# Patient Record
Sex: Male | Born: 1965 | Race: Black or African American | Hispanic: No | Marital: Married | Smoking: Former smoker
Health system: Southern US, Community
[De-identification: ages and names within clinical notes are randomized; demographics above are authoritative.]

## PROBLEM LIST (undated history)

## (undated) DIAGNOSIS — M1990 Unspecified osteoarthritis, unspecified site: Secondary | ICD-10-CM

## (undated) DIAGNOSIS — R52 Pain, unspecified: Secondary | ICD-10-CM

## (undated) DIAGNOSIS — Z789 Other specified health status: Secondary | ICD-10-CM

## (undated) DIAGNOSIS — K219 Gastro-esophageal reflux disease without esophagitis: Secondary | ICD-10-CM

## (undated) DIAGNOSIS — T7840XA Allergy, unspecified, initial encounter: Secondary | ICD-10-CM

## (undated) DIAGNOSIS — I10 Essential (primary) hypertension: Secondary | ICD-10-CM

## (undated) DIAGNOSIS — H409 Unspecified glaucoma: Secondary | ICD-10-CM

## (undated) DIAGNOSIS — G4730 Sleep apnea, unspecified: Secondary | ICD-10-CM

## (undated) DIAGNOSIS — M549 Dorsalgia, unspecified: Secondary | ICD-10-CM

## (undated) DIAGNOSIS — E785 Hyperlipidemia, unspecified: Secondary | ICD-10-CM

## (undated) HISTORY — DX: Sleep apnea, unspecified: G47.30

## (undated) HISTORY — PX: WISDOM TOOTH EXTRACTION: SHX21

## (undated) HISTORY — DX: Unspecified osteoarthritis, unspecified site: M19.90

## (undated) HISTORY — DX: Allergy, unspecified, initial encounter: T78.40XA

## (undated) HISTORY — DX: Hyperlipidemia, unspecified: E78.5

## (undated) HISTORY — DX: Unspecified glaucoma: H40.9

## (undated) HISTORY — PX: TONSILLECTOMY: SUR1361

---

## 2004-01-23 ENCOUNTER — Emergency Department (HOSPITAL_COMMUNITY): Admission: EM | Admit: 2004-01-23 | Discharge: 2004-01-23 | Payer: Self-pay | Admitting: Emergency Medicine

## 2004-01-28 ENCOUNTER — Emergency Department (HOSPITAL_COMMUNITY): Admission: EM | Admit: 2004-01-28 | Discharge: 2004-01-28 | Payer: Self-pay | Admitting: Emergency Medicine

## 2010-08-28 ENCOUNTER — Emergency Department (HOSPITAL_COMMUNITY)
Admission: EM | Admit: 2010-08-28 | Discharge: 2010-08-28 | Disposition: A | Discharge status: Home / Self Care | Payer: Worker's Compensation | Attending: Emergency Medicine | Admitting: Emergency Medicine

## 2010-08-28 ENCOUNTER — Emergency Department (HOSPITAL_COMMUNITY): Payer: Worker's Compensation

## 2010-08-28 DIAGNOSIS — Y929 Unspecified place or not applicable: Secondary | ICD-10-CM | POA: Insufficient documentation

## 2010-08-28 DIAGNOSIS — S335XXA Sprain of ligaments of lumbar spine, initial encounter: Secondary | ICD-10-CM | POA: Insufficient documentation

## 2010-08-28 DIAGNOSIS — Y990 Civilian activity done for income or pay: Secondary | ICD-10-CM | POA: Insufficient documentation

## 2010-08-28 DIAGNOSIS — X500XXA Overexertion from strenuous movement or load, initial encounter: Secondary | ICD-10-CM | POA: Insufficient documentation

## 2010-08-28 DIAGNOSIS — M545 Low back pain, unspecified: Secondary | ICD-10-CM | POA: Insufficient documentation

## 2011-08-22 ENCOUNTER — Encounter (HOSPITAL_COMMUNITY): Payer: Self-pay | Admitting: Emergency Medicine

## 2011-08-22 ENCOUNTER — Emergency Department (HOSPITAL_COMMUNITY): Payer: Self-pay

## 2011-08-22 ENCOUNTER — Emergency Department (HOSPITAL_COMMUNITY)
Admission: EM | Admit: 2011-08-22 | Discharge: 2011-08-22 | Disposition: A | Discharge status: Home / Self Care | Payer: Self-pay | Attending: Emergency Medicine | Admitting: Emergency Medicine

## 2011-08-22 DIAGNOSIS — R509 Fever, unspecified: Secondary | ICD-10-CM | POA: Insufficient documentation

## 2011-08-22 DIAGNOSIS — R197 Diarrhea, unspecified: Secondary | ICD-10-CM | POA: Insufficient documentation

## 2011-08-22 DIAGNOSIS — K089 Disorder of teeth and supporting structures, unspecified: Secondary | ICD-10-CM | POA: Insufficient documentation

## 2011-08-22 DIAGNOSIS — R112 Nausea with vomiting, unspecified: Secondary | ICD-10-CM | POA: Insufficient documentation

## 2011-08-22 DIAGNOSIS — R51 Headache: Secondary | ICD-10-CM | POA: Insufficient documentation

## 2011-08-22 DIAGNOSIS — K0889 Other specified disorders of teeth and supporting structures: Secondary | ICD-10-CM

## 2011-08-22 LAB — POCT I-STAT, CHEM 8
BUN: 9 mg/dL (ref 6–23)
Calcium, Ion: 1.09 mmol/L — ABNORMAL LOW (ref 1.12–1.32)
Chloride: 104 mEq/L (ref 96–112)
Creatinine, Ser: 1.1 mg/dL (ref 0.50–1.35)
Glucose, Bld: 112 mg/dL — ABNORMAL HIGH (ref 70–99)
HCT: 42 % (ref 39.0–52.0)
Hemoglobin: 14.3 g/dL (ref 13.0–17.0)
Potassium: 3.5 mEq/L (ref 3.5–5.1)
Sodium: 141 mEq/L (ref 135–145)
TCO2: 26 mmol/L (ref 0–100)

## 2011-08-22 MED ORDER — ACETAMINOPHEN 325 MG PO TABS
325.0000 mg | ORAL_TABLET | Freq: Once | ORAL | Status: AC
  Administered 2011-08-22: 325 mg via ORAL
  Filled 2011-08-22: qty 1

## 2011-08-22 MED ORDER — ACETAMINOPHEN 325 MG PO TABS
650.0000 mg | ORAL_TABLET | Freq: Once | ORAL | Status: AC
  Administered 2011-08-22: 650 mg via ORAL
  Filled 2011-08-22: qty 2

## 2011-08-22 MED ORDER — PENICILLIN V POTASSIUM 250 MG PO TABS: 250.0000 mg | ORAL_TABLET | Freq: Four times a day (QID) | ORAL | Status: AC

## 2011-08-22 MED ORDER — SODIUM CHLORIDE 0.9 % IV BOLUS (SEPSIS)
1000.0000 mL | Freq: Once | INTRAVENOUS | Status: AC
  Administered 2011-08-22: 1000 mL via INTRAVENOUS

## 2011-08-22 MED ORDER — ONDANSETRON HCL 4 MG/2ML IJ SOLN
4.0000 mg | Freq: Once | INTRAMUSCULAR | Status: AC
  Administered 2011-08-22: 4 mg via INTRAVENOUS
  Filled 2011-08-22: qty 2

## 2011-08-22 MED ORDER — KETOROLAC TROMETHAMINE 30 MG/ML IJ SOLN
30.0000 mg | Freq: Once | INTRAMUSCULAR | Status: AC
  Administered 2011-08-22: 30 mg via INTRAVENOUS
  Filled 2011-08-22: qty 1

## 2011-08-22 NOTE — ED Notes (Signed)
Patient was covered with a blanket in spite of my request not to do so.

## 2011-08-22 NOTE — ED Provider Notes (Signed)
History     CSN: [id number]  Arrival date & time 08/22/11  1909   First MD Initiated Contact with Patient 08/22/11 1924      Chief Complaint  Patient presents with  . Fever  . Headache    (Consider location/radiation/quality/duration/timing/severity/associated sxs/prior treatment) HPI Comments: Pt states that his temperature got as high as 102.5:pt denies cough:pt states that he is having generalized myalgias:pt states that he has a fullness on his gum  Patient is a 46 y.o. male presenting with fever. The history is provided by the patient. No language interpreter was used.  Fever Primary symptoms of the febrile illness include headaches, nausea, diarrhea and myalgias. Primary symptoms do not include fever, shortness of breath, abdominal pain or vomiting. The current episode started today. This is a new problem. The problem has not changed since onset.   History reviewed. No pertinent past medical history.  History reviewed. No pertinent past surgical history.  History reviewed. No pertinent family history.  History  Substance Use Topics  . Smoking status: Former Games developer  . Smokeless tobacco: Not on file  . Alcohol Use: No      Review of Systems  Constitutional: Negative for fever.  Respiratory: Negative for shortness of breath.   Cardiovascular: Negative.   Gastrointestinal: Positive for nausea and diarrhea. Negative for vomiting and abdominal pain.  Genitourinary: Negative.   Musculoskeletal: Positive for myalgias.  Neurological: Positive for headaches.    Allergies  Review of patient's allergies indicates no known allergies.  Home Medications   Current Outpatient Rx  Name Route Sig Dispense Refill  . CALCIUM CARBONATE ANTACID 500 MG PO CHEW Oral Chew 1 tablet by mouth as needed. For heartburn.      BP 135/79  Pulse 99  Temp(Src) 100.4 F (38 C) (Oral)  Resp 20  SpO2 95%  Physical Exam  Nursing note and vitals reviewed. Constitutional: He is oriented  to person, place, and time. He appears well-developed and well-nourished.  HENT:  Right Ear: External ear normal.  Left Ear: External ear normal.  Mouth/Throat: Oropharynx is clear and moist.       Pt has a fullness noted to right lower gum  Eyes: Conjunctivae and EOM are normal.  Neck: Normal range of motion. Neck supple.  Cardiovascular: Normal rate and regular rhythm.   Pulmonary/Chest: Effort normal and breath sounds normal.  Abdominal: Soft. Bowel sounds are normal. There is no tenderness.  Musculoskeletal: Normal range of motion.  Neurological: He is alert and oriented to person, place, and time.  Skin: Skin is warm and dry.  Psychiatric: He has a normal mood and affect.    ED Course  Procedures (including critical care time)  Labs Reviewed - No data to display Dg Chest 2 View  08/22/2011  *RADIOLOGY REPORT*  Clinical Data: Fever; history of smoking.  CHEST - 2 VIEW  Comparison: None.  Findings: The lungs are well-aerated.  Mild bilateral streaky atelectasis or scarring is noted.  Mild hilar prominence appears to reflect normal vasculature.  There is no evidence of pleural effusion or pneumothorax.  The heart is normal in size; the mediastinal contour is within normal limits.  No acute osseous abnormalities are seen.  IMPRESSION: Mild bilateral streaky atelectasis or scarring noted; no definite evidence of focal airspace consolidation.  Original Report Authenticated By: Tonia Ghent, M.D.     1. Diarrhea   2. Fever   3. Toothache       MDM  Pt is feeling a lot better  at this time:symptoms likely viral:will treat pt for the toothache:pt not having any meningismus signs        Teressa Lower, NP 08/22/11 2322

## 2011-08-22 NOTE — ED Provider Notes (Signed)
Medical screening examination/treatment/procedure(s) were performed by non-physician practitioner and as supervising physician I was immediately available for consultation/collaboration.  Adesuwa Osgood R. Natacha Jepsen, MD 08/22/11 2335 

## 2011-08-22 NOTE — ED Notes (Signed)
Patient is AOx4 and comfortable with his discharge instructions. 

## 2011-08-22 NOTE — ED Notes (Signed)
Patient complaining of headache, fever (102.5), dizziness, nausea, and body aches since this morning.  Patient states that his throat was irritated this morning, but this may be due to his allergies.  Denies recent cold symptoms, shortness of breath and chest pain.

## 2011-08-22 NOTE — Discharge Instructions (Signed)
Dental Pain A tooth ache may be caused by cavities (tooth decay). Cavities expose the nerve of the tooth to air and hot or cold temperatures. It may come from an infection or abscess (also called a boil or furuncle) around your tooth. It is also often caused by dental caries (tooth decay). This causes the pain you are having. DIAGNOSIS  Your caregiver can diagnose this problem by exam. TREATMENT   If caused by an infection, it may be treated with medications which kill germs (antibiotics) and pain medications as prescribed by your caregiver. Take medications as directed.   Only take over-the-counter or prescription medicines for pain, discomfort, or fever as directed by your caregiver.   Whether the tooth ache today is caused by infection or dental disease, you should see your dentist as soon as possible for further care.  SEEK MEDICAL CARE IF: The exam and treatment you received today has been provided on an emergency basis only. This is not a substitute for complete medical or dental care. If your problem worsens or new problems (symptoms) appear, and you are unable to meet with your dentist, call or return to this location. SEEK IMMEDIATE MEDICAL CARE IF:   You have a fever.   You develop redness and swelling of your face, jaw, or neck.   You are unable to open your mouth.   You have severe pain uncontrolled by pain medicine.  MAKE SURE YOU:   Understand these instructions.   Will watch your condition.   Will get help right away if you are not doing well or get worse.  Document Released: 04/06/2005 Document Revised: 03/26/2011 Document Reviewed: 11/23/2007 [facility] Patient Information 2012 [place], [place].Diarrhea Diarrhea is watery poop (stool). The most common cause of diarrhea is a germ. Other causes include:  Food poisoning.   A reaction to medicine.  HOME CARE   Drink clear fluids. This can stop you from losing too much body fluid (dehydration).   Drink enough fluids  to keep your pee (urine) clear or pale yellow.   Avoid solid foods and dairy products until you start to feel better. Then start eating bland foods, such as:   Bananas.   Rice.   Crackers.   Applesauce.   Dry toast.   Avoid spicy foods, caffeine, and alcohol.   Your doctor may give medicine to help with cramps and watery poop. Take this as told. Avoid these medicines if you have a fever or blood in your poop.   Take your medicine as told. Finish them even if you start to feel better.  GET HELP RIGHT AWAY IF:   The watery poop lasts longer than 3 days.   You have a fever.   Your baby is older than 3 months with a rectal temperature of 100.5 F (38.1 C) or higher for more than 1 day.   There is blood in your poop.   You start to throw up (vomit).   You lose too much fluid.  MAKE SURE YOU:   Understand these instructions.   Will watch your condition.   Will get help right away if you are not doing well or get worse.  Document Released: 09/23/2007 Document Revised: 03/26/2011 Document Reviewed: 09/23/2007 [facility] Patient Information 2012 [place], [place].Fever  Fever is a higher-than-normal body temperature. A normal temperature varies with:  Age.   How it is measured (mouth, underarm, rectal, or ear).   Time of day.  In an adult, an oral temperature around 98.6 Fahrenheit (F) or  37 Celsius (C) is considered normal. A rise in temperature of about 1.8 F or 1 C is generally considered a fever (100.4 F or 38 C). In an infant age 12 days or less, a rectal temperature of 100.4 F (38 C) generally is regarded as fever. Fever is not a disease but can be a symptom of illness. CAUSES   Fever is most commonly caused by infection.   Some non-infectious problems can cause fever. For example:   Some arthritis problems.   Problems with the thyroid or adrenal glands.   Immune system problems.   Some kinds of cancer.   A reaction to certain medicines.    Occasionally, the source of a fever cannot be determined. This is sometimes called a "Fever of Unknown Origin" (FUO).   Some situations may lead to a temporary rise in body temperature that may go away on its own. Examples are:   Childbirth.   Surgery.   Some situations may cause a rise in body temperature but these are not considered "true fever". Examples are:   Intense exercise.   Dehydration.   Exposure to high outside or room temperatures.  SYMPTOMS   Feeling warm or hot.   Fatigue or feeling exhausted.   Aching all over.   Chills.   Shivering.   Sweats.  DIAGNOSIS  A fever can be suspected by your caregiver feeling that your skin is unusually warm. The fever is confirmed by taking a temperature with a thermometer. Temperatures can be taken different ways. Some methods are accurate and some are not: With adults, adolescents, and children:   An oral temperature is used most commonly.   An ear thermometer will only be accurate if it is positioned as recommended by the manufacturer.   Under the arm temperatures are not accurate and not recommended.   Most electronic thermometers are fast and accurate.  Infants and Toddlers:  Rectal temperatures are recommended and most accurate.   Ear temperatures are not accurate in this age group and are not recommended.   Skin thermometers are not accurate.  RISKS AND COMPLICATIONS   During a fever, the body uses more oxygen, so a person with a fever may develop rapid breathing or shortness of breath. This can be dangerous especially in people with heart or lung disease.   The sweats that occur following a fever can cause dehydration.   High fever can cause seizures in infants and children.   Older persons can develop confusion during a fever.  TREATMENT   Medications may be used to control temperature.   Do not give aspirin to children with fevers. There is an association with Reye's syndrome. Reye's syndrome is a  rare but potentially deadly disease.   If an infection is present and medications have been prescribed, take them as directed. Finish the full course of medications until they are gone.   Sponging or bathing with room-temperature water may help reduce body temperature. Do not use ice water or alcohol sponge baths.   Do not over-bundle children in blankets or heavy clothes.   Drinking adequate fluids during an illness with fever is important to prevent dehydration.  HOME CARE INSTRUCTIONS   For adults, rest and adequate fluid intake are important. Dress according to how you feel, but do not over-bundle.   Drink enough water and/or fluids to keep your urine clear or pale yellow.   For infants over 3 months and children, giving medication as directed by your caregiver to control fever  can help with comfort. The amount to be given is based on the child's weight. Do NOT give more than is recommended.  SEEK MEDICAL CARE IF:   You or your child are unable to keep fluids down.   Vomiting or diarrhea develops.   You develop a skin rash.   An oral temperature above 102 F (38.9 C) develops, or a fever which persists for over 3 days.   You develop excessive weakness, dizziness, fainting or extreme thirst.   Fevers keep coming back after 3 days.  SEEK IMMEDIATE MEDICAL CARE IF:   Shortness of breath or trouble breathing develops   You pass out.   You feel you are making little or no urine.   New pain develops that was not there before (such as in the head, neck, chest, back, or abdomen).   You cannot hold down fluids.   Vomiting and diarrhea persist for more than a day or two.   You develop a stiff neck and/or your eyes become sensitive to light.   An unexplained temperature above 102 F (38.9 C) develops.  Document Released: 04/06/2005 Document Revised: 03/26/2011 Document Reviewed: 03/22/2008 [facility] Patient Information 2012 [place], [place].

## 2012-02-08 ENCOUNTER — Other Ambulatory Visit: Payer: Self-pay | Admitting: Orthopedic Surgery

## 2012-02-08 DIAGNOSIS — M519 Unspecified thoracic, thoracolumbar and lumbosacral intervertebral disc disorder: Secondary | ICD-10-CM

## 2012-02-17 ENCOUNTER — Other Ambulatory Visit: Payer: Self-pay | Admitting: Orthopedic Surgery

## 2012-02-17 ENCOUNTER — Ambulatory Visit
Admission: RE | Admit: 2012-02-17 | Discharge: 2012-02-17 | Disposition: A | Discharge status: Home / Self Care | Payer: Medicaid Other | Source: Ambulatory Visit | Attending: Orthopedic Surgery | Admitting: Orthopedic Surgery

## 2012-02-17 VITALS — BP 125/72 | HR 54 | Temp 98.3°F | Resp 12

## 2012-02-17 DIAGNOSIS — M519 Unspecified thoracic, thoracolumbar and lumbosacral intervertebral disc disorder: Secondary | ICD-10-CM

## 2012-02-17 DIAGNOSIS — M549 Dorsalgia, unspecified: Secondary | ICD-10-CM

## 2012-02-17 DIAGNOSIS — Z789 Other specified health status: Secondary | ICD-10-CM

## 2012-02-17 HISTORY — DX: Other specified health status: Z78.9

## 2012-02-17 MED ORDER — KETOROLAC TROMETHAMINE 30 MG/ML IJ SOLN
30.0000 mg | Freq: Once | INTRAMUSCULAR | Status: AC
  Administered 2012-02-17: 30 mg via INTRAVENOUS

## 2012-02-17 MED ORDER — CEFAZOLIN SODIUM 1-5 GM-% IV SOLN
1.0000 g | Freq: Once | INTRAVENOUS | Status: AC
  Administered 2012-02-17: 1 g via INTRAVENOUS

## 2012-02-17 MED ORDER — FENTANYL CITRATE 0.05 MG/ML IJ SOLN
25.0000 ug | INTRAMUSCULAR | Status: DC | PRN
  Administered 2012-02-17: 100 ug via INTRAVENOUS

## 2012-02-17 MED ORDER — IOHEXOL 180 MG/ML  SOLN
7.0000 mL | Freq: Once | INTRAMUSCULAR | Status: AC | PRN
  Administered 2012-02-17: 7 mL

## 2012-02-17 MED ORDER — MIDAZOLAM HCL 2 MG/2ML IJ SOLN
1.0000 mg | INTRAMUSCULAR | Status: DC | PRN
  Administered 2012-02-17: 1 mg via INTRAVENOUS

## 2012-02-17 MED ORDER — SODIUM CHLORIDE 0.9 % IV SOLN
Freq: Once | INTRAVENOUS | Status: AC
  Administered 2012-02-17: 08:00:00 via INTRAVENOUS

## 2012-02-17 NOTE — Progress Notes (Signed)
Heart sounds regular and lungs clear bilaterally. 

## 2012-02-17 NOTE — Progress Notes (Addendum)
Pt is comfortable at present.   0910 sedation time 25 minutes.dd

## 2012-03-25 ENCOUNTER — Emergency Department (HOSPITAL_COMMUNITY): Payer: Medicaid Other

## 2012-03-25 ENCOUNTER — Emergency Department (HOSPITAL_COMMUNITY)
Admission: EM | Admit: 2012-03-25 | Discharge: 2012-03-25 | Disposition: A | Discharge status: Home / Self Care | Payer: Medicaid Other | Attending: Emergency Medicine | Admitting: Emergency Medicine

## 2012-03-25 ENCOUNTER — Encounter (HOSPITAL_COMMUNITY): Payer: Self-pay | Admitting: *Deleted

## 2012-03-25 DIAGNOSIS — E876 Hypokalemia: Secondary | ICD-10-CM | POA: Insufficient documentation

## 2012-03-25 DIAGNOSIS — Z79899 Other long term (current) drug therapy: Secondary | ICD-10-CM | POA: Insufficient documentation

## 2012-03-25 DIAGNOSIS — K219 Gastro-esophageal reflux disease without esophagitis: Secondary | ICD-10-CM | POA: Insufficient documentation

## 2012-03-25 DIAGNOSIS — R079 Chest pain, unspecified: Secondary | ICD-10-CM

## 2012-03-25 DIAGNOSIS — Z87891 Personal history of nicotine dependence: Secondary | ICD-10-CM | POA: Insufficient documentation

## 2012-03-25 HISTORY — DX: Gastro-esophageal reflux disease without esophagitis: K21.9

## 2012-03-25 LAB — BASIC METABOLIC PANEL
BUN: 11 mg/dL (ref 6–23)
CO2: 27 mEq/L (ref 19–32)
CO2: 27 mEq/L (ref 19–32)
Calcium: 8.7 mg/dL (ref 8.4–10.5)
Calcium: 8.6 mg/dL (ref 8.4–10.5)
Chloride: 106 mEq/L (ref 96–112)
Creatinine, Ser: 0.94 mg/dL (ref 0.50–1.35)
Creatinine, Ser: 0.95 mg/dL (ref 0.50–1.35)
GFR calc Af Amer: >90 mL/min (ref >90)
GFR calc non Af Amer: >90 mL/min (ref >90)

## 2012-03-25 LAB — CBC
MCH: 26.8 pg (ref 26.0–34.0)
MCHC: 32.1 g/dL (ref 30.0–36.0)
MCV: 83.4 fL (ref 78.0–100.0)
Platelets: 245 10*3/uL (ref 150–400)
RBC: 4.63 MIL/uL (ref 4.22–5.81)

## 2012-03-25 LAB — POCT I-STAT TROPONIN I: Troponin i, poc: 0 ng/mL (ref 0.00–0.08)

## 2012-03-25 MED ORDER — PANTOPRAZOLE SODIUM 40 MG IV SOLR
40.0000 mg | Freq: Once | INTRAVENOUS | Status: AC
  Administered 2012-03-25: 40 mg via INTRAVENOUS
  Filled 2012-03-25: qty 40

## 2012-03-25 MED ORDER — POTASSIUM CHLORIDE CRYS ER 20 MEQ PO TBCR
40.0000 meq | EXTENDED_RELEASE_TABLET | Freq: Once | ORAL | Status: AC
  Administered 2012-03-25: 40 meq via ORAL
  Filled 2012-03-25: qty 2

## 2012-03-25 MED ORDER — POTASSIUM CHLORIDE 10 MEQ/100ML IV SOLN
10.0000 meq | Freq: Once | INTRAVENOUS | Status: AC
  Administered 2012-03-25: 10 meq via INTRAVENOUS
  Filled 2012-03-25: qty 100

## 2012-03-25 MED ORDER — ASPIRIN 325 MG PO TABS
325.0000 mg | ORAL_TABLET | ORAL | Status: AC
  Administered 2012-03-25: 325 mg via ORAL
  Filled 2012-03-25: qty 1

## 2012-03-25 MED ORDER — FAMOTIDINE IN NACL 20-0.9 MG/50ML-% IV SOLN
20.0000 mg | Freq: Once | INTRAVENOUS | Status: AC
  Administered 2012-03-25: 20 mg via INTRAVENOUS
  Filled 2012-03-25: qty 50

## 2012-03-25 MED ORDER — NITROGLYCERIN 0.4 MG SL SUBL: 0.4000 mg | SUBLINGUAL_TABLET | SUBLINGUAL | Status: DC | PRN

## 2012-03-25 MED ORDER — NITROGLYCERIN 0.4 MG SL SUBL
0.4000 mg | SUBLINGUAL_TABLET | SUBLINGUAL | Status: DC | PRN
  Administered 2012-03-25: 0.4 mg via SUBLINGUAL
  Filled 2012-03-25: qty 25

## 2012-03-25 NOTE — ED Notes (Signed)
Pt returned from xray

## 2012-03-25 NOTE — ED Provider Notes (Signed)
Medical screening examination/treatment/procedure(s) were performed by non-physician practitioner and as supervising physician I was immediately available for consultation/collaboration.  Chinedum Vanhouten, MD 03/25/12 0803 

## 2012-03-25 NOTE — ED Notes (Signed)
Pt updated on plan of care: aware of pending medications and internal medicine consult.

## 2012-03-25 NOTE — ED Notes (Signed)
Patient transported to X-ray 

## 2012-03-25 NOTE — ED Notes (Signed)
Telephone order received from Dr. Algie Coffer. Ok to d/c patient home with office follow up for next week. Also given script for nitrogylcerin and instructed on use as well as when to return to emergency room.

## 2012-03-25 NOTE — ED Notes (Signed)
Pt A.O. X 4. Reports left sided chest pain 3/10 described as pressure. Radiated to neck, back, and shoulder. States "some times it hurts on the right, sometimes the left. Sometimes both sides at once". Several episodes, intermittent x 6 months. NAD. NSR. Vitals stable. Family at bedside. Updated on plan of care: aware that he will be transported to x-ray shortly. No further needs at this time.

## 2012-03-25 NOTE — H&P (Signed)
Kyle Pham is an 46 y.o. male.   Chief Complaint: Chest pain HPI: 46 years old male with left sided chest pain and neck pain with LUE numbness this AM. Currently chest pain free. No fever or cough or trauma.  Past Medical History  Diagnosis Date  . No pertinent past medical history Feb 17, 2012     none at this time  . GERD (gastroesophageal reflux disease)       Past Surgical History  Procedure Date  . Tonsillectomy 46 years old  . Wisdom tooth extraction 46 years old    History reviewed. No pertinent family history. Social History:  reports that he quit smoking about 8 months ago. His smoking use included Cigarettes. He has a 16 pack-year smoking history. He does not have any smokeless tobacco history on file. He reports that he does not drink alcohol or use illicit drugs.  Allergies: No Known Allergies   (Not in a hospital admission)  Results for orders placed during the hospital encounter of 03/25/12 (from the past 48 hour(s))  CBC     Status: Abnormal   Collection Time   03/25/12  2:17 AM      Component Value Range Comment   WBC 8.3  4.0 - 10.5 K/uL    RBC 4.63  4.22 - 5.81 MIL/uL    Hemoglobin 12.4 (*) 13.0 - 17.0 g/dL    HCT 16.1 (*) 09.6 - 52.0 %    MCV 83.4  78.0 - 100.0 fL    MCH 26.8  26.0 - 34.0 pg    MCHC 32.1  30.0 - 36.0 g/dL    RDW 04.5  40.9 - 81.1 %    Platelets 245  150 - 400 K/uL   BASIC METABOLIC PANEL     Status: Abnormal   Collection Time   03/25/12  2:17 AM      Component Value Range Comment   Sodium 143  135 - 145 mEq/L    Potassium 2.9 (*) 3.5 - 5.1 mEq/L    Chloride 105  96 - 112 mEq/L    CO2 27  19 - 32 mEq/L    Glucose, Bld 141 (*) 70 - 99 mg/dL    BUN 13  6 - 23 mg/dL    Creatinine, Ser 9.14  0.50 - 1.35 mg/dL    Calcium 8.7  8.4 - 78.2 mg/dL    GFR calc non Af Amer >90  >90 mL/min    GFR calc Af Amer >90  >90 mL/min   POCT I-STAT TROPONIN I     Status: Normal   Collection Time   03/25/12  4:18 AM      Component Value Range  Comment   Troponin i, poc 0.00  0.00 - 0.08 ng/mL    Comment 3             Dg Chest 2 View  03/25/2012  *RADIOLOGY REPORT*  Clinical Data: Anterior chest pain.  CHEST - 2 VIEW  Comparison: Chest radiograph performed 08/22/2011  Findings: The lungs are well-aerated.  Minimal left midlung scarring is seen.  There is no evidence of focal opacification, pleural effusion or pneumothorax.  The heart is normal in size; the mediastinal contour is within normal limits. Slight left hilar prominence is stable in appearance, and likely reflects normal vasculature.  No acute osseous abnormalities are seen.  IMPRESSION: No acute cardiopulmonary process seen.   Original Report Authenticated By: Tonia Ghent, M.D.     @ROS @ No weight gain,  No vision or speech problem, No stroke, seizures or psych admission, + chest pain, No asthma, No GI bleed or kidney stone.  Physical Exam   Blood pressure 124/65, pulse 56, temperature 98.2 F (36.8 C), temperature source Oral, resp. rate 16, SpO2 97.00%.  Constitutional: He appears well-developed and well-nourished. No distress.  HENT: Normocephalic and atraumatic. Eyes: Manson Passey, conj-pink, Sclera-white.  Neck: Normal range of motion.  Cardiovascular: Normal rate, regular rhythm. S1 and S2 normal Pulmonary/Chest: Effort normal and breath sounds normal. No respiratory distress. He exhibits no tenderness.  No pleuritic pain reported  Abdominal: Soft. Bowel sounds are normal. Non-distended and non-tender. Musculoskeletal: Normal range of motion.  Neurological: He is alert and oriented to person, place, and time.  Skin: Skin is warm and dry. No rash noted.  Psychiatric: He has a normal mood and affect. His behavior is normal.   Assessment/Plan Chest pain r/o CAD Hypokalemia  Repeat troponin I, K + replacement Nuclear stress test v/s cardiac cath.  Joanette Silveria S 03/25/2012, 6:31 AM

## 2012-03-25 NOTE — ED Provider Notes (Signed)
History     CSN: [id number]  Arrival date & time 03/25/12  0139   First MD Initiated Contact with Patient 03/25/12 0228      Chief Complaint  Patient presents with  . Chest Pain    (Consider location/radiation/quality/duration/timing/severity/associated sxs/prior treatment) HPI History provided by pt.   Pt got up to use bathroom at 1am today.  On his way back to bed he developed pain/paresthesias of LUE as well pain in left side of chest.  Associated w/ mild SOB and slight nausea.  Sx have been constant ever since, but have gradually improved.  Pt has chronic neck pain from remote injury, as well as intermittent LUE paresthesias, particularly when he extends his neck.  Symptoms different this morning because unrelieved by shaking out arm.  Has also had intermittent, migrating CP for the past 6 months.  Pain non-exertional, no associated sx and relieved by pepcid.  He attributes to acid reflux.  Has discussed w/ Dr. Algie Coffer who plans to schedule him for stress echo.  Pt denies fever, cough, abdominal pain, recent exertional SOB and lower extremity edema.  Denies trauma.   Past Medical History  Diagnosis Date  . No pertinent past medical history Feb 17, 2012     none at this time  . GERD (gastroesophageal reflux disease)     Past Surgical History  Procedure Date  . Tonsillectomy 46 years old  . Wisdom tooth extraction 46 years old    History reviewed. No pertinent family history.  History  Substance Use Topics  . Smoking status: Former Smoker -- 0.5 packs/day for 32 years    Types: Cigarettes    Quit date: 07/18/2011  . Smokeless tobacco: Not on file  . Alcohol Use: No      Review of Systems  All other systems reviewed and are negative.    Allergies  Review of patient's allergies indicates no known allergies.  Home Medications   Current Outpatient Rx  Name  Route  Sig  Dispense  Refill  . BISMUTH SUBSALICYLATE 262 MG/15ML PO SUSP   Oral   Take 15 mLs by mouth  every 6 (six) hours as needed. For nausea         . FAMOTIDINE 10 MG PO CHEW   Oral   Chew 10 mg by mouth 2 (two) times daily.         Marland Kitchen NAPROXEN SODIUM 220 MG PO TABS   Oral   Take 220 mg by mouth 2 (two) times daily as needed. For pain           BP 135/75  Pulse 58  Temp 98.2 F (36.8 C) (Oral)  Resp 16  SpO2 97%  Physical Exam  Nursing note and vitals reviewed. Constitutional: He is oriented to person, place, and time. He appears well-developed and well-nourished. No distress.  HENT:  Head: Normocephalic and atraumatic.  Eyes:       Normal appearance  Neck: Normal range of motion.  Cardiovascular: Normal rate, regular rhythm and intact distal pulses.   Pulmonary/Chest: Effort normal and breath sounds normal. No respiratory distress. He exhibits no tenderness.       No pleuritic pain reported  Abdominal: Soft. Bowel sounds are normal. He exhibits no distension. There is no tenderness. There is no guarding.  Musculoskeletal: Normal range of motion.       No peripheral edema or calf tenderness.  Pain in chest is not aggravated by passive ROM of LUE.  LUE w/out edema, skin  changes or tenderness.  No pain w/ ROM of wrist and elbow.  2+ radial pulse and distal sensation intact.      Neurological: He is alert and oriented to person, place, and time.  Skin: Skin is warm and dry. No rash noted.  Psychiatric: He has a normal mood and affect. His behavior is normal.    ED Course  Procedures (including critical care time)   Date: 03/25/2012  Rate: 77  Rhythm: normal sinus rhythm  QRS Axis: normal  Intervals: normal  ST/T Wave abnormalities: nonspecific ST/T changes  Conduction Disutrbances:none  Narrative Interpretation:   Old EKG Reviewed: none available   Labs Reviewed  CBC - Abnormal; Notable for the following:    Hemoglobin 12.4 (*)     HCT 38.6 (*)     All other components within normal limits  BASIC METABOLIC PANEL - Abnormal; Notable for the following:     Potassium 2.9 (*)     Glucose, Bld 141 (*)     All other components within normal limits  POCT I-STAT TROPONIN I   Dg Chest 2 View  03/25/2012  *RADIOLOGY REPORT*  Clinical Data: Anterior chest pain.  CHEST - 2 VIEW  Comparison: Chest radiograph performed 08/22/2011  Findings: The lungs are well-aerated.  Minimal left midlung scarring is seen.  There is no evidence of focal opacification, pleural effusion or pneumothorax.  The heart is normal in size; the mediastinal contour is within normal limits. Slight left hilar prominence is stable in appearance, and likely reflects normal vasculature.  No acute osseous abnormalities are seen.  IMPRESSION: No acute cardiopulmonary process seen.   Original Report Authenticated By: Tonia Ghent, M.D.      1. Chest pain   2. Hypokalemia       MDM  Healthy 46yo M presents w/ non-exertional L-sided CP w/ associated mild SOB as well as LUE paresthesias.  Has had intermittent CP for the past 6 months which has been attributed to GERD and of which Dr. Algie Coffer is aware and planning to obtain stress echo. LUE paresthesias are also chronic and have been attributed to cervical radiculopathy.  Difference in sx tonight is prolonged duration.  Pain minimal currently.  EKG shows diffuse flipped t waves and slight ST depression.  No prior for comparison.  Labs otherwise sig for neg troponin and hypokalemia, likely d/t recent viral GI illness.  Consulted Dr. Algie Coffer and he will admit patient for further evaluation.  Discussed w/ patient and his wife.  He has received aspirin as well as potassium supplementation.         Otilio Miu, PA-C 03/25/12 [phone]

## 2012-03-25 NOTE — ED Notes (Signed)
Pt is here for "chest pain every other day for 6 months",  Pt was evaluated "years ago" for similar pain and diagnosed with GERD and given pepcid AC.  Pt denies any n/v with this.  Does have sob with this at times.

## 2012-03-25 NOTE — ED Provider Notes (Signed)
I was asked by the patient's nurse, who spoke directly to Dr. Algie Coffer, to provide rx for nitroglycerin for this patient who is being discharged to home.   Renne Crigler, [place] 03/25/12 1040

## 2012-03-26 NOTE — ED Provider Notes (Signed)
Medical screening examination/treatment/procedure(s) were performed by non-physician practitioner and as supervising physician I was immediately available for consultation/collaboration.  Sunnie Nielsen, MD 03/26/12 [phone]

## 2012-05-04 ENCOUNTER — Encounter: Payer: Self-pay | Admitting: Physical Medicine & Rehabilitation

## 2012-05-17 ENCOUNTER — Encounter: Payer: Medicaid Other | Attending: Physical Medicine & Rehabilitation

## 2012-05-17 ENCOUNTER — Ambulatory Visit (HOSPITAL_BASED_OUTPATIENT_CLINIC_OR_DEPARTMENT_OTHER): Payer: Medicaid Other | Admitting: Physical Medicine & Rehabilitation

## 2012-05-17 ENCOUNTER — Telehealth: Payer: Self-pay | Admitting: Physical Medicine & Rehabilitation

## 2012-05-17 ENCOUNTER — Encounter: Payer: Self-pay | Admitting: Physical Medicine & Rehabilitation

## 2012-05-17 VITALS — BP 147/76 | HR 56 | Resp 14 | Ht 70.0 in | Wt 242.0 lb

## 2012-05-17 DIAGNOSIS — Z5181 Encounter for therapeutic drug level monitoring: Secondary | ICD-10-CM

## 2012-05-17 DIAGNOSIS — M79609 Pain in unspecified limb: Secondary | ICD-10-CM | POA: Insufficient documentation

## 2012-05-17 DIAGNOSIS — G8929 Other chronic pain: Secondary | ICD-10-CM | POA: Insufficient documentation

## 2012-05-17 DIAGNOSIS — M549 Dorsalgia, unspecified: Secondary | ICD-10-CM

## 2012-05-17 DIAGNOSIS — M545 Low back pain, unspecified: Secondary | ICD-10-CM | POA: Insufficient documentation

## 2012-05-17 DIAGNOSIS — G894 Chronic pain syndrome: Secondary | ICD-10-CM | POA: Insufficient documentation

## 2012-05-17 DIAGNOSIS — IMO0001 Reserved for inherently not codable concepts without codable children: Secondary | ICD-10-CM

## 2012-05-17 DIAGNOSIS — M542 Cervicalgia: Secondary | ICD-10-CM

## 2012-05-17 DIAGNOSIS — M7918 Myalgia, other site: Secondary | ICD-10-CM | POA: Insufficient documentation

## 2012-05-17 DIAGNOSIS — M47817 Spondylosis without myelopathy or radiculopathy, lumbosacral region: Secondary | ICD-10-CM | POA: Insufficient documentation

## 2012-05-17 MED ORDER — DULOXETINE HCL 30 MG PO CPEP: 30.0000 mg | ORAL_CAPSULE | Freq: Every day | ORAL | Status: DC

## 2012-05-17 MED ORDER — METHOCARBAMOL 750 MG PO TABS: 750.0000 mg | ORAL_TABLET | Freq: Every evening | ORAL | Status: DC | PRN

## 2012-05-17 NOTE — Telephone Encounter (Signed)
Patient wants to know if Dr. Wynn Banker still wants him to have x-ray even though he brought in MRI results. Also, patient wants to know if you are using [facility] on [address]

## 2012-05-17 NOTE — Telephone Encounter (Signed)
yes

## 2012-05-17 NOTE — Patient Instructions (Signed)
Will do medial branch blocks to evaluate for lumbar arthritis as the cause of your pain After that we will do EMG to evaluate the cause of your left leg pain I do not think narcotics will be necessary for your pain symptoms We'll try Cymbalta 30 mg per day Methocarbamol 750 mg at night may repeat x1 Physical therapy 4 visits for neck pain also evaluate for TENS unit

## 2012-05-17 NOTE — Telephone Encounter (Signed)
Message forwarded to Dr. Wynn Banker

## 2012-05-17 NOTE — Progress Notes (Signed)
Subjective:    Patient ID: Kyle Pham, male    DOB: Feb 15, 1966, 47 y.o.   MRN: [id number]  HPI 47 year old male with long history of back pain. Work injury 08/28/2010. Workers comp case was settled. Was treated in the past by Dr. Georgiann Hahn as well as Dr. Madelon Lips. Evaluated outside of the workers comp system by Dr. Rochele Pages on 02/05/2012. D. Discography was performed at [facility] imaging. L3-4 no concordant pain L4-5 concordant pain but no posterior annular tear L5-S1 concordant pain but no annular tear. No evidence of nerve root impingement. Secondary complaint is neck pain Tried L5 epidural which was not helpful. Physical therapy to workers comp for low back pain. Patient reports it was not helpful.  Tried 7.5 mg Vicodin tablets in October. Takes Aleve 2 tablets twice a day. This helps a little for his neck about 20-25%. Does not help back pain. Changing positions helps with the pain  Reviewed notes from Dr Alveda Reasons and [facility] Imaging Pain Inventory Average Pain 8 Pain Right Now 10 My pain is sharp, burning, stabbing, tingling and aching  In the last 24 hours, has pain interfered with the following? General activity 9 Relation with others 9 Enjoyment of life 10 What TIME of day is your pain at its worst? all Sleep (in general) Poor  Pain is worse with: walking, bending, sitting, standing and some activites Pain improves with: no pain med Relief from Meds: no pain med  Mobility walk without assistance how many minutes can you walk? 5-10 do you drive?  yes  Function not employed: date last employed  I need assistance with the following:  dressing, bathing, meal prep, household duties and shopping  Neuro/Psych weakness numbness tingling trouble walking  Prior Studies Any changes since last visit?  no  CT discogram Oct 2013 L3-L4: There is a moderate sized extraforaminal annular tear on  the right without definite encroachment of the nerve root in the  foramen.  There is no posterior annular rent in the canal. It is  possible the right L3 nerve root could be irritated.  Circumferential annular spread of contrast along with a small  Schmorl's nodes incidentally noted. No significant facet  arthropathy.  L4-L5: No posterior or posterolateral annular tear is seen. There  is a prominent anterior annular tear extending toward the  retroperitoneum. Moderate sized Schmorl's node is seen.  Circumferential annular spread of contrast. No spinal stenosis or  L5 nerve root encroachment. No significant foraminal narrowing.  L5-S1: 2 mm of facet mediated retrolisthesis L5 on S1. No pars  defects. Mild facet arthropathy. Moderate sized Schmorl's node.  No annular tears identified.   Physicians involved in your care Neurosurgeon [place]--no surgery   Family History  Problem Relation Age of Onset  . Diabetes Mother   . Hypertension Mother   . Glaucoma Mother   . Kidney disease Mother    History   Social History  . Marital Status: Married    Spouse Name: N/A    Number of Children: N/A  . Years of Education: N/A   Social History Main Topics  . Smoking status: Former Smoker -- 0.5 packs/day for 32 years    Types: Cigarettes    Quit date: 07/18/2011  . Smokeless tobacco: Never Used  . Alcohol Use: No  . Drug Use: No  . Sexually Active: None   Other Topics Concern  . None   Social History Narrative  . None   Past Surgical History  Procedure Date  . Tonsillectomy 47 years  old  . Wisdom tooth extraction 47 years old   Past Medical History  Diagnosis Date  . No pertinent past medical history Feb 17, 2012     none at this time  . GERD (gastroesophageal reflux disease)    BP 147/76  Pulse 56  Resp 14  Ht 5\' 10"  (1.778 m)  Wt 242 lb (109.77 kg)  BMI 34.72 kg/m2  SpO2 95%   Review of Systems  Musculoskeletal: Positive for gait problem.  Neurological: Positive for weakness and numbness.       Tingling   All other systems reviewed and  are negative.       Objective:   Physical Exam  Nursing note and vitals reviewed. Constitutional: He appears well-developed.       overwt  HENT:  Head: Normocephalic and atraumatic.  Eyes: Conjunctivae normal and EOM are normal. Pupils are equal, round, and reactive to light.  Neurological: He is alert. No cranial nerve deficit. Gait abnormal. Coordination normal.       Positive Waddell sign axial compression Assist with head forward Tenderness over the occipital area bilateral Giveaway weakness left ankle otherwise normal strength Decreased sensation left L3-L4 L5-S1 dermatomal distribution  Psychiatric: His affect is blunt. His speech is delayed. He is slowed.          Assessment & Plan:  1. Lumbar pain. No clear-cut reason for this. The discogram was inconclusive. There is some evidence of arthritis in the L5-S1 joint. Further assessment with medial branch blocks recommended. 2. Left leg pain as well as numbness. CAT scan does not show any nerve compression. Would need to check an EMG to further assess 3. Neck pain appears to be muscular. Would check it x-ray of his neck as well. Would benefit from physical therapy for this diagnosis. Will also order a TENS unit. 4. Chronic pain syndrome may have some depression related to this. Trial of Cymbalta this is also indicated for chronic back pain. Will also try Robaxin at night.

## 2012-05-18 NOTE — Telephone Encounter (Signed)
Please have patient follow up on xray per Dr Wynn Banker.

## 2012-05-30 ENCOUNTER — Ambulatory Visit
Payer: Medicaid Other | Attending: Physical Medicine & Rehabilitation | Admitting: Rehabilitative and Restorative Service Providers"

## 2012-06-16 ENCOUNTER — Ambulatory Visit: Payer: Medicaid Other | Admitting: Physical Medicine & Rehabilitation

## 2012-06-16 ENCOUNTER — Encounter: Payer: Medicaid Other | Attending: Physical Medicine & Rehabilitation

## 2012-06-16 DIAGNOSIS — M79609 Pain in unspecified limb: Secondary | ICD-10-CM | POA: Insufficient documentation

## 2012-06-16 DIAGNOSIS — M542 Cervicalgia: Secondary | ICD-10-CM | POA: Insufficient documentation

## 2012-06-16 DIAGNOSIS — M549 Dorsalgia, unspecified: Secondary | ICD-10-CM | POA: Insufficient documentation

## 2012-06-16 DIAGNOSIS — G894 Chronic pain syndrome: Secondary | ICD-10-CM | POA: Insufficient documentation

## 2012-06-16 DIAGNOSIS — M47817 Spondylosis without myelopathy or radiculopathy, lumbosacral region: Secondary | ICD-10-CM | POA: Insufficient documentation

## 2012-06-16 DIAGNOSIS — M545 Low back pain, unspecified: Secondary | ICD-10-CM | POA: Insufficient documentation

## 2012-08-24 ENCOUNTER — Emergency Department (HOSPITAL_COMMUNITY)
Admission: EM | Admit: 2012-08-24 | Discharge: 2012-08-24 | Disposition: A | Discharge status: Home / Self Care | Payer: Medicaid Other | Attending: Emergency Medicine | Admitting: Emergency Medicine

## 2012-08-24 ENCOUNTER — Encounter (HOSPITAL_COMMUNITY): Payer: Self-pay | Admitting: Emergency Medicine

## 2012-08-24 ENCOUNTER — Emergency Department (HOSPITAL_COMMUNITY): Payer: Medicaid Other

## 2012-08-24 DIAGNOSIS — Z87891 Personal history of nicotine dependence: Secondary | ICD-10-CM | POA: Insufficient documentation

## 2012-08-24 DIAGNOSIS — I10 Essential (primary) hypertension: Secondary | ICD-10-CM | POA: Insufficient documentation

## 2012-08-24 DIAGNOSIS — K219 Gastro-esophageal reflux disease without esophagitis: Secondary | ICD-10-CM | POA: Insufficient documentation

## 2012-08-24 DIAGNOSIS — Z79899 Other long term (current) drug therapy: Secondary | ICD-10-CM | POA: Insufficient documentation

## 2012-08-24 DIAGNOSIS — R079 Chest pain, unspecified: Secondary | ICD-10-CM | POA: Insufficient documentation

## 2012-08-24 HISTORY — DX: Essential (primary) hypertension: I10

## 2012-08-24 LAB — CBC
Hemoglobin: 13.4 g/dL (ref 13.0–17.0)
MCH: 27.2 pg (ref 26.0–34.0)
MCHC: 33.5 g/dL (ref 30.0–36.0)

## 2012-08-24 LAB — BASIC METABOLIC PANEL
Calcium: 8.6 mg/dL (ref 8.4–10.5)
GFR calc non Af Amer: 79 mL/min — ABNORMAL LOW (ref >90)
Glucose, Bld: 114 mg/dL — ABNORMAL HIGH (ref 70–99)
Sodium: 137 mEq/L (ref 135–145)

## 2012-08-24 LAB — POCT I-STAT TROPONIN I: Troponin i, poc: 0 ng/mL (ref 0.00–0.08)

## 2012-08-24 MED ORDER — OMEPRAZOLE 20 MG PO CPDR: 20.0000 mg | DELAYED_RELEASE_CAPSULE | Freq: Every day | ORAL | Status: DC

## 2012-08-24 MED ORDER — GI COCKTAIL ~~LOC~~
30.0000 mL | Freq: Once | ORAL | Status: AC
  Administered 2012-08-24: 30 mL via ORAL
  Filled 2012-08-24: qty 30

## 2012-08-24 MED ORDER — ASPIRIN 325 MG PO TABS
325.0000 mg | ORAL_TABLET | ORAL | Status: AC
  Administered 2012-08-24: 325 mg via ORAL
  Filled 2012-08-24: qty 4

## 2012-08-24 MED ORDER — NITROGLYCERIN 0.4 MG SL SUBL: 0.4000 mg | SUBLINGUAL_TABLET | SUBLINGUAL | Status: DC | PRN

## 2012-08-24 NOTE — ED Notes (Signed)
PT. REPORTS LEFT CHEST PAIN  RADIATING TO LEFT ARM , SLIGHT SOB , DRY COUGH , DENIES NAUSEA OR VOMITTING . DENIES DIAPHORESIS . RATES PAIN 7/10 . NO MEDS PTA.

## 2012-08-24 NOTE — ED Provider Notes (Signed)
History     CSN: [id number]  Arrival date & time 08/24/12  0105   First MD Initiated Contact with Patient 08/24/12 0220      No chief complaint on file.  HPI Kyle Pham is a 47 y.o. male presenting with left-sided chest pain. He says this feels like a prior episode chest pain he's had in the past. He sees Dr. could not get and recently had a negative stress test. He says the pain is a sharp, started at 12:30 AM, it is 6/10 which is slightly better than onset (7/10) but was potentially reflux but did have some radiation into the left arm, no diaphoresis, no nausea vomiting, no shortness of breath. Patient took hypertensive medicine at one point, but Dr. did not give: He could stop. No history of diabetes or hyperlipidemia. No family history of coronary artery disease. Patient quit smoking last March. Patient does take famotidine occasionally for reflux. He has taken nothing for the pain so far. She has had similar symptoms in the past, symptoms are typically worse at night after he lays down.  Past Medical History  Diagnosis Date  . No pertinent past medical history Feb 17, 2012     none at this time  . GERD (gastroesophageal reflux disease)   . Hypertension     Past Surgical History  Procedure Laterality Date  . Tonsillectomy  47 years old  . Wisdom tooth extraction  48 years old    Family History  Problem Relation Age of Onset  . Diabetes Mother   . Hypertension Mother   . Glaucoma Mother   . Kidney disease Mother     History  Substance Use Topics  . Smoking status: Former Smoker -- 0.50 packs/day for 32 years    Types: Cigarettes    Quit date: 07/18/2011  . Smokeless tobacco: Never Used  . Alcohol Use: No      Review of Systems At least 10pt or greater review of systems completed and are negative except where specified in the HPI.  Allergies  Review of patient's allergies indicates no known allergies.  Home Medications   Current Outpatient Rx  Name  Route   Sig  Dispense  Refill  . dorzolamide-timolol (COSOPT) 22.3-6.8 MG/ML ophthalmic solution   Both Eyes   Place 1 drop into both eyes 2 (two) times daily.         Marland Kitchen latanoprost (XALATAN) 0.005 % ophthalmic solution   Both Eyes   Place 1 drop into both eyes at bedtime.           BP 136/83  Pulse 58  Temp(Src) 98.6 F (37 C) (Oral)  Resp 14  SpO2 99%  Physical Exam  Nursing notes reviewed.  Electronic medical record reviewed. VITAL SIGNS:   Filed Vitals:   08/24/12 0400 08/24/12 0430 08/24/12 0500 08/24/12 0530  BP: 95/52 113/70 109/75 124/73  Pulse: 54 53 51 56  Temp:      TempSrc:      Resp:      SpO2: 96% 94% 95% 95%   CONSTITUTIONAL: Awake, oriented, appears non-toxic HENT: Atraumatic, normocephalic, oral mucosa pink and moist, airway patent. Nares patent without drainage. External ears normal. EYES: Conjunctiva clear, EOMI, PERRLA NECK: Trachea midline, non-tender, supple CARDIOVASCULAR: Normal heart rate, Normal rhythm, No murmurs, rubs, gallops PULMONARY/CHEST: Clear to auscultation, no rhonchi, wheezes, or rales. Symmetrical breath sounds. Non-tender. ABDOMINAL: Non-distended, soft, non-tender - no rebound or guarding.  BS normal. NEUROLOGIC: Non-focal, moving all four extremities, no  gross sensory or motor deficits. EXTREMITIES: No clubbing, cyanosis, or edema SKIN: Warm, Dry, No erythema, No rash  ED Course  Procedures (including critical care time)  Date: 08/24/2012  Rate: 54  Rhythm: Sinus bradycardia with  QRS Axis: normal  Intervals: normal  ST/T Wave abnormalities: Patient has had a history of flipped T waves in leads 3, has had flipped T waves across the precordial leads V3 through V6, these are now flat except in 3  Conduction Disutrbances: none  Narrative Interpretation: No significant changes consistent with acute ischemia or infarction, morphology looks similar to prior EKG, actually it improved.  Labs Reviewed  CBC - Abnormal; Notable for the  following:    WBC 12.9 (*)    All other components within normal limits  BASIC METABOLIC PANEL - Abnormal; Notable for the following:    Glucose, Bld 114 (*)    GFR calc non Af Amer 79 (*)    All other components within normal limits  PRO B NATRIURETIC PEPTIDE  POCT I-STAT TROPONIN I  POCT I-STAT TROPONIN I   Dg Chest Port 1 View  08/24/2012  *RADIOLOGY REPORT*  Clinical Data: 47 year old male left side chest pain.  PORTABLE CHEST - 1 VIEW  Comparison: 03/25/2012 and earlier.  Findings: Portable semi upright AP view at 0132 hours.  Mildly lower lung volumes.  Cardiac size and mediastinal contours are within normal limits.  Visualized tracheal air column is within normal limits.  No pneumothorax, pulmonary edema, pleural effusion or acute pulmonary opacity.  IMPRESSION: No acute cardiopulmonary abnormality.   Original Report Authenticated By: Erskine Speed, M.D.    1. Chest pain   2. Gastroesophageal reflux    Medications  nitroGLYCERIN (NITROSTAT) SL tablet 0.4 mg (not administered)  aspirin tablet 325 mg (325 mg Oral Given 08/24/12 0144)  gi cocktail (Maalox,Lidocaine,Donnatal) (30 mLs Oral Given 08/24/12 0242)     MDM  Kyle Pham is a 47 y.o. male presents with chest pain. Patient thinks is reflux but said "I don't want to take a chance."  Treated the patient with GI cocktail which completely resolved his chest pain.  EKG is unremarkable, troponin x2 were negative, I suspicion for acute cord syndrome in this patient is low, he is a low risk chest pain patient, also recently had a negative stress test. Patient will start omeprazole once in the evenings prior to dinner since most of his symptoms seem to be at night. Patient to followup with Dr. Algie Coffer in 2 days.         Jones Skene, MD 08/24/12 [phone]

## 2012-08-24 NOTE — ED Notes (Signed)
Pt refusing at this time due to headache and states "I just had laser eye surgery so it makes me feel like my head is going to explode."

## 2013-08-23 ENCOUNTER — Encounter: Payer: Self-pay | Admitting: Dietician

## 2013-08-23 ENCOUNTER — Encounter: Payer: Medicare Other | Attending: Family Medicine | Admitting: Dietician

## 2013-08-23 VITALS — Ht 71.0 in | Wt 259.2 lb

## 2013-08-23 DIAGNOSIS — E669 Obesity, unspecified: Secondary | ICD-10-CM

## 2013-08-23 DIAGNOSIS — Z6830 Body mass index (BMI) 30.0-30.9, adult: Secondary | ICD-10-CM | POA: Insufficient documentation

## 2013-08-23 DIAGNOSIS — Z713 Dietary counseling and surveillance: Secondary | ICD-10-CM | POA: Insufficient documentation

## 2013-08-23 NOTE — Progress Notes (Signed)
Medical Nutrition Therapy:  Appt start time: 8502 end time:  1630.   Assessment:  Primary concerns today: Obesity - has gained weight after losing job due to back injury. Tries to be active but feels limited. Wants to see if he can get a treadmill. Has gained 60 lbs since then, 2012. Wants to be healthy. He lives with wife and kids (31 and 60 and 69) but is home alone for 10 hours when they are at work and school.  Patient skips meals, is inactive and is limited by back injury, and consumes foods and drinks that are high in sugar.  Preferred Learning Style:   No preference indicated   Learning Readiness:   Contemplating  MEDICATIONS: See Chart   DIETARY INTAKE:  Usual eating pattern includes 2 meals and 1-2 snacks per day.  Everyday foods include fruit.  Avoided foods include has been told to avoid eggs, cheese, shrimp and lobster - loves lobster, He avoids beef and pork  Likes: seafood, fruit  24-hr recall:  Wake: 5:30/6:00 a.m. With wife 7 days a week - takes cholesterol medicine 30 minutes before eats B (9-11:00 AM): Bagel with cream cheese, waffles, [place] bacon, some eggs, loves oatmeal but doesn't eat often, grits, pancakes, leftovers from the night before, sandwich, water Snk ( AM): none  L (1:30/2:00 PM): Sandwich - [place], lettuce, light mayo, vinegar, or hot dog, water, some chips and a pickle Snk ( PM): fruit if ate later in the morning and had more of a lunch D ( PM): hamburger helper, tacos, chicken, spaghetti, lasagna very occasional. LOVEs salad, hot dogs, chicken nuggets, strips, or fried, baked fish, sometimes fried fish, wife makes him have vegetables, they have potatoes but he doesn't eat, mac and cheese, potato salad, green beans, sweet peas, broccoli, mixed vegetables - seasons with smoked [place], water most of the time Snk ( PM): tries not to Beverages: likes Popeyes half and half sweet tea and lemonade  Eat out: Less than weekly McDonald's or Cook out  -hamburger, shrimp and broccoli, Pizza from [employer] once a month.  On weekends he doesn't cook "every man for himself", will eat what's in the house if his wife wants take out.  Hungry in the morning but makes himself not eat Full: when he eats fresh fruit-but is then hungry soon after.  Tries not to eat past 10:00  Usual physical activity: mows lawn, yard work - keeps busy, washes car, plays with god children - considers family as active.  Estimated energy needs: 2200 calories  Progress Towards Goal(s):  No progress.   Nutritional Diagnosis:  Cool-3.3 Overweight/obesity As related to inactivity, meal skipping, and high calorie, high sugar food choices.  As evidenced by BMI greater than 30.    Intervention:  Nutrition counseling and education.  1) Eat breakfast everyday -Egg whites and fruit or toast -Peanut butter on banana or apple -Peanut butter toast or sandwich - whole wheat  2) Pair protein with carbohydrate for snacks and meals - Handful of nuts, peanuts, almonds, walnuts, pecans and fruit - Carrots and ranch dressing  Sources of protein: -Meat -Seafood -Milk, yogurt, cheese -Nuts and nut butters -Eggs -Beans  3) Limit sugar-sweetened beverages and sweets    4) Increase activity as able -Walk- start out with 3 days a week - as back tolerates  Teaching Method Utilized:  Visual Auditory   Handouts given during visit include:  Snack handout   Barriers to learning/adherence to lifestyle change: Desire to make changes  Demonstrated degree of understanding via:  Teach Back   Monitoring/Evaluation:  Dietary intake, exercise, and body weight in 3 month(s).

## 2013-08-23 NOTE — Patient Instructions (Signed)
1) Eat breakfast everyday -Egg whites and fruit or toast -Peanut butter on banana or apple -Peanut butter toast or sandwich - whole wheat  2) Pair protein with carbohydrate for snacks and meals - Handful of nuts, peanuts, almonds, walnuts, pecans and fruit - Carrots and ranch dressing  Sources of protein: -Meat -Seafood -Milk, yogurt, cheese -Nuts and nut butters -Eggs -Beans  3) Limit sugar-sweetened beverages and sweets    4) Increase activity as able -Walk- start out with 3 days a week - as back tolerates

## 2013-11-01 ENCOUNTER — Ambulatory Visit: Payer: Medicare Other | Admitting: Dietician

## 2013-11-03 ENCOUNTER — Emergency Department (HOSPITAL_COMMUNITY)
Admission: EM | Admit: 2013-11-03 | Discharge: 2013-11-03 | Disposition: A | Discharge status: Home / Self Care | Payer: Medicare Other | Attending: Emergency Medicine | Admitting: Emergency Medicine

## 2013-11-03 ENCOUNTER — Encounter (HOSPITAL_COMMUNITY): Payer: Self-pay | Admitting: Emergency Medicine

## 2013-11-03 DIAGNOSIS — I10 Essential (primary) hypertension: Secondary | ICD-10-CM | POA: Diagnosis not present

## 2013-11-03 DIAGNOSIS — S90569A Insect bite (nonvenomous), unspecified ankle, initial encounter: Secondary | ICD-10-CM | POA: Diagnosis not present

## 2013-11-03 DIAGNOSIS — Y9389 Activity, other specified: Secondary | ICD-10-CM | POA: Insufficient documentation

## 2013-11-03 DIAGNOSIS — Y9289 Other specified places as the place of occurrence of the external cause: Secondary | ICD-10-CM | POA: Insufficient documentation

## 2013-11-03 DIAGNOSIS — W57XXXA Bitten or stung by nonvenomous insect and other nonvenomous arthropods, initial encounter: Secondary | ICD-10-CM | POA: Diagnosis present

## 2013-11-03 DIAGNOSIS — Z79899 Other long term (current) drug therapy: Secondary | ICD-10-CM | POA: Insufficient documentation

## 2013-11-03 DIAGNOSIS — Z87891 Personal history of nicotine dependence: Secondary | ICD-10-CM | POA: Diagnosis not present

## 2013-11-03 DIAGNOSIS — K219 Gastro-esophageal reflux disease without esophagitis: Secondary | ICD-10-CM | POA: Insufficient documentation

## 2013-11-03 DIAGNOSIS — E785 Hyperlipidemia, unspecified: Secondary | ICD-10-CM | POA: Insufficient documentation

## 2013-11-03 NOTE — ED Notes (Signed)
Possible bee sting to right lateral ankle. Mild redness noted. States felt sting while he was mowing the lawn this am. No signs of allergic reaction. Describes pain as "pins and needles".

## 2013-11-03 NOTE — ED Provider Notes (Signed)
CSN: [id number]     Arrival date & time 11/03/13  1004 History  This chart was scribed for non-physician practitioner, Quincy Carnes, PA-C,working with Wandra Arthurs, MD, by Marlowe Kays, ED Scribe.  This patient was seen in room TR10C/TR10C and the patient's care was started at 10:33 AM.  Chief Complaint  Patient presents with  . Insect Bite   The history is provided by the patient. No language interpreter was used.   HPI Comments:  Kyle Pham is a 48 y.o. male who presents to the Emergency Department complaining of an insect bite to his right lateral ankle that occurred approximately one hour ago while mowing his lawn. He states he did not see anything bite him but he felt a stinging sensation. He reports associated swelling. Pt denies fever, puncture wound, redness, bleeding, drainage or warmth to the area. No throat swelling or SOB.  No known allergens.  No intervention tried PTA.  Past Medical History  Diagnosis Date  . No pertinent past medical history Feb 17, 2012     none at this time  . GERD (gastroesophageal reflux disease)   . Hypertension   . Hyperlipidemia    Past Surgical History  Procedure Laterality Date  . Tonsillectomy  48 years old  . Wisdom tooth extraction  48 years old   Family History  Problem Relation Age of Onset  . Diabetes Mother   . Hypertension Mother   . Glaucoma Mother   . Kidney disease Mother    History  Substance Use Topics  . Smoking status: Former Smoker -- 0.50 packs/day for 32 years    Types: Cigarettes    Quit date: 07/18/2011  . Smokeless tobacco: Never Used  . Alcohol Use: No    Review of Systems  Constitutional: Negative for fever.  Skin: Negative for color change and wound.       Insect bite to right ankle  All other systems reviewed and are negative.   Allergies  Review of patient's allergies indicates no known allergies.  Home Medications   Prior to Admission medications   Medication Sig Start Date End Date  Taking? Authorizing Provider  dorzolamide-timolol (COSOPT) 22.3-6.8 MG/ML ophthalmic solution Place 1 drop into both eyes 2 (two) times daily.    Historical Provider, MD  fluticasone (FLONASE) 50 MCG/ACT nasal spray Place into both nostrils as needed for allergies or rhinitis.    Historical Provider, MD  gabapentin (NEURONTIN) 300 MG capsule Take 300 mg by mouth 3 (three) times daily.    Historical Provider, MD  gemfibrozil (LOPID) 600 MG tablet Take 600 mg by mouth 2 (two) times daily before a meal.    Historical Provider, MD  latanoprost (XALATAN) 0.005 % ophthalmic solution Place 1 drop into both eyes at bedtime.    Historical Provider, MD  montelukast (SINGULAIR) 10 MG tablet Take 10 mg by mouth at bedtime.    Historical Provider, MD  omeprazole (PRILOSEC) 20 MG capsule Take 1 capsule (20 mg total) by mouth daily. 08/24/12   John-Adam Bonk, MD  oxycodone (OXY-IR) 5 MG capsule Take 5 mg by mouth every 4 (four) hours as needed.    Historical Provider, MD   Triage Vitals: BP 135/84  Pulse 82  Temp(Src) 98.3 F (36.8 C) (Oral)  Resp 16  Ht 5\' 11"  (1.803 m)  Wt 259 lb 3.2 oz (117.572 kg)  BMI 36.17 kg/m2  SpO2 96% Physical Exam  Nursing note and vitals reviewed. Constitutional: He is oriented to person, place, and  time. He appears well-developed and well-nourished.  HENT:  Head: Normocephalic and atraumatic.  Mouth/Throat: Oropharynx is clear and moist.  No oral lesions, airway patent  Eyes: Conjunctivae and EOM are normal. Pupils are equal, round, and reactive to light.  Neck: Normal range of motion. Neck supple.  Cardiovascular: Normal rate, regular rhythm and normal heart sounds.   Pulmonary/Chest: Effort normal and breath sounds normal.  Abdominal: Soft. Bowel sounds are normal.  Musculoskeletal: Normal range of motion. He exhibits edema. He exhibits no tenderness.  Small amount of swelling along lateral aspect of right foot. No erythema or warmth to touch. No visible bite. Foot NVI.   Neurological: He is alert and oriented to person, place, and time.  Skin: Skin is warm and dry. No erythema.  Psychiatric: He has a normal mood and affect.    ED Course  Procedures (including critical care time) DIAGNOSTIC STUDIES: Oxygen Saturation is 96% on RA, adequate by my interpretation.   COORDINATION OF CARE: 10:39 AM- Advised pt to take OTC Benadryl. Pt verbalizes understanding and agrees to plan.  Medications - No data to display  Labs Review Labs Reviewed - No data to display  Imaging Review No results found.   EKG Interpretation None      MDM   Final diagnoses:  Bug bite   Alleged bug bite with localized rxn without definitie bite identified.  No erythema, induration, or cellulitis.  Foot remains NVI without signs of septic joint. No oral lesions or airway compromise.  VS stable.  Instructed to start benadryl at home, may continue to ice and elevate to help with swelling.  Monitor closely for any new sx.  Discussed plan with patient, he/she acknowledged understanding and agreed with plan of care.  Return precautions given for new or worsening symptoms.  I personally performed the services described in this documentation, which was scribed in my presence. The recorded information has been reviewed and is accurate.  Larene Pickett, PA-C 11/03/13 1215

## 2013-11-03 NOTE — Discharge Instructions (Signed)
Recommend taking benadryl to help with swelling.  May continue icing ankle for comfort. Return to the ED for new or worsening symptoms.

## 2013-11-03 NOTE — ED Notes (Signed)
He was mowing yard and felt something bite his R ankle. Raised area noted with no redness

## 2013-11-04 NOTE — ED Provider Notes (Signed)
Medical screening examination/treatment/procedure(s) were performed by non-physician practitioner and as supervising physician I was immediately available for consultation/collaboration.   EKG Interpretation None        Wandra Arthurs, MD 11/04/13 1059

## 2014-03-19 ENCOUNTER — Emergency Department (HOSPITAL_COMMUNITY)
Admission: EM | Admit: 2014-03-19 | Discharge: 2014-03-19 | Discharge status: Against Medical Advice | Payer: Managed Care, Other (non HMO) | Attending: Emergency Medicine | Admitting: Emergency Medicine

## 2014-03-19 ENCOUNTER — Encounter (HOSPITAL_COMMUNITY): Payer: Self-pay | Admitting: Emergency Medicine

## 2014-03-19 DIAGNOSIS — I10 Essential (primary) hypertension: Secondary | ICD-10-CM | POA: Diagnosis not present

## 2014-03-19 DIAGNOSIS — Z720 Tobacco use: Secondary | ICD-10-CM | POA: Insufficient documentation

## 2014-03-19 DIAGNOSIS — R200 Anesthesia of skin: Secondary | ICD-10-CM | POA: Insufficient documentation

## 2014-03-19 HISTORY — DX: Pain, unspecified: R52

## 2014-03-19 HISTORY — DX: Dorsalgia, unspecified: M54.9

## 2014-03-19 NOTE — ED Notes (Signed)
Reports history of "2 torn disc"- he believes it is L4-L5. C/o L arm numbness "a few days ago" that resolved.  L leg numbness since Saturday.  States it feels like "pins and needles" in L hand tonight. Denies weakness.  No neuro deficits noted on triage exam.

## 2014-03-19 NOTE — ED Notes (Signed)
Patient not in ED waiting.

## 2014-04-13 ENCOUNTER — Encounter (HOSPITAL_COMMUNITY): Payer: Self-pay | Admitting: *Deleted

## 2014-04-13 ENCOUNTER — Emergency Department (HOSPITAL_COMMUNITY)
Admission: EM | Admit: 2014-04-13 | Discharge: 2014-04-13 | Disposition: A | Discharge status: Home / Self Care | Payer: Medicare Other | Attending: Emergency Medicine | Admitting: Emergency Medicine

## 2014-04-13 ENCOUNTER — Emergency Department (HOSPITAL_COMMUNITY): Payer: Medicare Other

## 2014-04-13 DIAGNOSIS — I10 Essential (primary) hypertension: Secondary | ICD-10-CM | POA: Insufficient documentation

## 2014-04-13 DIAGNOSIS — Z791 Long term (current) use of non-steroidal anti-inflammatories (NSAID): Secondary | ICD-10-CM | POA: Insufficient documentation

## 2014-04-13 DIAGNOSIS — R079 Chest pain, unspecified: Secondary | ICD-10-CM | POA: Diagnosis present

## 2014-04-13 DIAGNOSIS — R109 Unspecified abdominal pain: Secondary | ICD-10-CM | POA: Insufficient documentation

## 2014-04-13 DIAGNOSIS — Z87891 Personal history of nicotine dependence: Secondary | ICD-10-CM | POA: Diagnosis not present

## 2014-04-13 DIAGNOSIS — E785 Hyperlipidemia, unspecified: Secondary | ICD-10-CM | POA: Insufficient documentation

## 2014-04-13 DIAGNOSIS — M79602 Pain in left arm: Secondary | ICD-10-CM | POA: Diagnosis not present

## 2014-04-13 DIAGNOSIS — Z7951 Long term (current) use of inhaled steroids: Secondary | ICD-10-CM | POA: Insufficient documentation

## 2014-04-13 DIAGNOSIS — R0789 Other chest pain: Secondary | ICD-10-CM | POA: Insufficient documentation

## 2014-04-13 DIAGNOSIS — Z79899 Other long term (current) drug therapy: Secondary | ICD-10-CM | POA: Insufficient documentation

## 2014-04-13 DIAGNOSIS — K219 Gastro-esophageal reflux disease without esophagitis: Secondary | ICD-10-CM | POA: Insufficient documentation

## 2014-04-13 LAB — CBC
HCT: 39.3 % (ref 39.0–52.0)
Hemoglobin: 12 g/dL — ABNORMAL LOW (ref 13.0–17.0)
MCH: 25.8 pg — AB (ref 26.0–34.0)
MCHC: 30.5 g/dL (ref 30.0–36.0)
MCV: 84.3 fL (ref 78.0–100.0)
PLATELETS: 295 10*3/uL (ref 150–400)
RBC: 4.66 MIL/uL (ref 4.22–5.81)
RDW: 12.9 % (ref 11.5–15.5)
WBC: 12 10*3/uL — ABNORMAL HIGH (ref 4.0–10.5)

## 2014-04-13 LAB — I-STAT TROPONIN, ED
TROPONIN I, POC: 0 ng/mL (ref 0.00–0.08)
Troponin i, poc: 0 ng/mL (ref 0.00–0.08)

## 2014-04-13 LAB — BASIC METABOLIC PANEL
ANION GAP: 8 (ref 5–15)
BUN: 12 mg/dL (ref 6–23)
CALCIUM: 8.8 mg/dL (ref 8.4–10.5)
CO2: 24 mmol/L (ref 19–32)
Chloride: 107 mEq/L (ref 96–112)
Creatinine, Ser: 1.1 mg/dL (ref 0.50–1.35)
GFR, EST NON AFRICAN AMERICAN: 78 mL/min — AB (ref >90)
Glucose, Bld: 153 mg/dL — ABNORMAL HIGH (ref 70–99)
POTASSIUM: 3.4 mmol/L — AB (ref 3.5–5.1)
SODIUM: 139 mmol/L (ref 135–145)

## 2014-04-13 LAB — D-DIMER, QUANTITATIVE: D-Dimer, Quant: 0.27 ug/mL-FEU (ref 0.00–0.48)

## 2014-04-13 NOTE — ED Notes (Signed)
Patient reports he was laying in bed, developed chest pain and left arm pain.  He did not take any meds prior to arrival.  Patient states he has hx of similar sx and was dx with GERD.  Patient takes meds for this each morning,.  Denies eating prior to going to bed,  Patient is alert.  Skin warm and dry

## 2014-04-13 NOTE — ED Provider Notes (Signed)
CSN: [id number]     Arrival date & time 04/13/14  0139 History  This chart was scribe for Julianne Rice, MD by Judithann Sauger, ED Scribe. The patient was seen in room A08C/A08C and the patient's care was started at 2:37 AM.    Chief Complaint  Patient presents with  . Chest Pain  . Arm Pain   Patient is a 48 y.o. male presenting with chest pain and arm pain. The history is provided by the patient. No language interpreter was used.  Chest Pain Pain location:  Substernal area Pain radiates to the back: no   Duration:  2 hours Timing:  Constant Progression:  Worsening Chronicity:  Recurrent Associated symptoms: abdominal pain   Associated symptoms: no back pain, no dizziness, no headache, no nausea, no numbness, no shortness of breath, not vomiting and no weakness   Arm Pain Associated symptoms include chest pain and abdominal pain. Pertinent negatives include no headaches and no shortness of breath.   HPI Comments: Kyle Pham is a 48 y.o. male with a hx of GERD who presents to the Emergency Department complaining of a gradually worsening constant substernal CP onset 2 and a half hours ago when he was laying down and left arm pain.  He denies N/V, abdominal pain, SOB, and blood in stool. He reports that the pain is similar to his previous GERD symptoms. He states that the pain in his chest and arm has resolved since arriving to the ER. He denies taking a lot of ibuprofen or naprosyn. He denies any recent surgeries or recent travel. He also denies smoking. He reports that his mother passed away due to a cardiac arrest recently.   Past Medical History  Diagnosis Date  . No pertinent past medical history Feb 17, 2012     none at this time  . GERD (gastroesophageal reflux disease)   . Hypertension   . Hyperlipidemia   . Back pain   . Pain management    Past Surgical History  Procedure Laterality Date  . Tonsillectomy  48 years old  . Wisdom tooth extraction  48 years old    Family History  Problem Relation Age of Onset  . Diabetes Mother   . Hypertension Mother   . Glaucoma Mother   . Kidney disease Mother    History  Substance Use Topics  . Smoking status: Former Smoker -- 0.50 packs/day for 32 years    Types: Cigarettes    Quit date: 07/18/2011  . Smokeless tobacco: Never Used  . Alcohol Use: No    Review of Systems  Respiratory: Negative for shortness of breath.   Cardiovascular: Positive for chest pain.  Gastrointestinal: Positive for abdominal pain. Negative for nausea, vomiting and blood in stool.  Musculoskeletal: Negative for myalgias, back pain, neck pain and neck stiffness.  Skin: Negative for rash and wound.  Neurological: Negative for dizziness, weakness, light-headedness, numbness and headaches.  All other systems reviewed and are negative.     Allergies  Review of patient's allergies indicates no known allergies.  Home Medications   Prior to Admission medications   Medication Sig Start Date End Date Taking? Authorizing Provider  cetirizine (ZYRTEC) 10 MG tablet Take 10 mg by mouth daily.   Yes Historical Provider, MD  dorzolamide-timolol (COSOPT) 22.3-6.8 MG/ML ophthalmic solution Place 1 drop into both eyes 2 (two) times daily.   Yes Historical Provider, MD  fluticasone (FLONASE) 50 MCG/ACT nasal spray Place 2 sprays into both nostrils daily.    Yes  Historical Provider, MD  gabapentin (NEURONTIN) 300 MG capsule Take 300 mg by mouth 3 (three) times daily.   Yes Historical Provider, MD  gemfibrozil (LOPID) 600 MG tablet Take 600 mg by mouth 2 (two) times daily before a meal.   Yes Historical Provider, MD  latanoprost (XALATAN) 0.005 % ophthalmic solution Place 1 drop into both eyes at bedtime.   Yes Historical Provider, MD  montelukast (SINGULAIR) 10 MG tablet Take 10 mg by mouth at bedtime.   Yes Historical Provider, MD  naproxen sodium (ANAPROX) 220 MG tablet Take 220 mg by mouth as needed (for pain).   Yes Historical  Provider, MD  omeprazole (PRILOSEC) 20 MG capsule Take 1 capsule (20 mg total) by mouth daily. 08/24/12  Yes John-Adam Bonk, MD   BP 122/67 mmHg  Pulse 76  Temp(Src) 98.4 F (36.9 C) (Oral)  Resp 20  Ht 5\' 11"  (1.803 m)  Wt 250 lb (113.399 kg)  BMI 34.88 kg/m2  SpO2 98% Physical Exam  Constitutional: He is oriented to person, place, and time. He appears well-developed and well-nourished. No distress.  HENT:  Head: Normocephalic and atraumatic.  Mouth/Throat: Oropharynx is clear and moist.  Eyes: EOM are normal. Pupils are equal, round, and reactive to light.  Neck: Normal range of motion. Neck supple.  Cardiovascular: Normal rate and regular rhythm.   Pulmonary/Chest: Effort normal and breath sounds normal. No respiratory distress. He has no wheezes. He has no rales. He exhibits no tenderness.  Abdominal: Soft. Bowel sounds are normal. He exhibits no distension and no mass. There is no tenderness. There is no rebound and no guarding.  Musculoskeletal: Normal range of motion. He exhibits no edema or tenderness.  No calf swelling or tenderness.  Neurological: He is alert and oriented to person, place, and time.  Skin: Skin is warm and dry. No rash noted. No erythema.  Psychiatric: He has a normal mood and affect. His behavior is normal.  Nursing note and vitals reviewed.   ED Course  Procedures (including critical care time) DIAGNOSTIC STUDIES: Oxygen Saturation is 97% on RA, adequate by my interpretation.    COORDINATION OF CARE: 2:43 AM- Pt advised of plan for treatment and pt agrees.    Labs Review Labs Reviewed  CBC - Abnormal; Notable for the following:    WBC 12.0 (*)    Hemoglobin 12.0 (*)    MCH 25.8 (*)    All other components within normal limits  BASIC METABOLIC PANEL - Abnormal; Notable for the following:    Potassium 3.4 (*)    Glucose, Bld 153 (*)    GFR calc non Af Amer 78 (*)    All other components within normal limits  D-DIMER, QUANTITATIVE  I-STAT  TROPOININ, ED  Randolm Idol, ED    Imaging Review Dg Chest 2 View  04/13/2014   CLINICAL DATA:  Acute onset of mid chest pain.  Initial encounter.  EXAM: CHEST  2 VIEW  COMPARISON:  Chest radiograph performed 08/24/2012  FINDINGS: The lungs are well-aerated. Minimal bilateral atelectasis is noted. Mild vascular congestion is seen. There is no evidence of pleural effusion or pneumothorax.  The heart is normal in size; the mediastinal contour is within normal limits. No acute osseous abnormalities are seen.  IMPRESSION: Minimal bilateral atelectasis noted; mild vascular congestion seen.   Electronically Signed   By: Garald Balding M.D.   On: 04/13/2014 03:28     EKG Interpretation None      Date: 04/13/2014  Rate:  69  Rhythm: normal sinus rhythm  QRS Axis: normal  Intervals: normal  ST/T Wave abnormalities: nonspecific T wave changes  Conduction Disutrbances:none  Narrative Interpretation:   Old EKG Reviewed: unchanged   MDM   Final diagnoses:  Chest pain      I personally performed the services described in this documentation, which was scribed in my presence. The recorded information has been reviewed and is accurate.  Patient with atypical chest pain. EKG is unchanged. Troponin 2 is negative. We'll discharge patient home. Patient to follow-up with cardiology as outpatient. Given return precautions and patient voiced understanding.  Julianne Rice, MD 04/13/14 [phone]

## 2014-04-13 NOTE — ED Notes (Signed)
Pt. Refused wheelchair and left with all belongings 

## 2014-04-13 NOTE — Discharge Instructions (Signed)

## 2014-10-11 ENCOUNTER — Emergency Department (HOSPITAL_COMMUNITY)
Admission: EM | Admit: 2014-10-11 | Discharge: 2014-10-11 | Disposition: A | Discharge status: Home / Self Care | Payer: Medicare Other | Attending: Emergency Medicine | Admitting: Emergency Medicine

## 2014-10-11 ENCOUNTER — Emergency Department (HOSPITAL_COMMUNITY): Payer: Medicare Other

## 2014-10-11 ENCOUNTER — Encounter (HOSPITAL_COMMUNITY): Payer: Self-pay | Admitting: *Deleted

## 2014-10-11 DIAGNOSIS — E785 Hyperlipidemia, unspecified: Secondary | ICD-10-CM | POA: Insufficient documentation

## 2014-10-11 DIAGNOSIS — R0602 Shortness of breath: Secondary | ICD-10-CM | POA: Diagnosis not present

## 2014-10-11 DIAGNOSIS — Z79899 Other long term (current) drug therapy: Secondary | ICD-10-CM | POA: Insufficient documentation

## 2014-10-11 DIAGNOSIS — Z87891 Personal history of nicotine dependence: Secondary | ICD-10-CM | POA: Insufficient documentation

## 2014-10-11 DIAGNOSIS — R200 Anesthesia of skin: Secondary | ICD-10-CM | POA: Diagnosis not present

## 2014-10-11 DIAGNOSIS — Z7951 Long term (current) use of inhaled steroids: Secondary | ICD-10-CM | POA: Diagnosis not present

## 2014-10-11 DIAGNOSIS — R079 Chest pain, unspecified: Secondary | ICD-10-CM | POA: Diagnosis not present

## 2014-10-11 DIAGNOSIS — R202 Paresthesia of skin: Secondary | ICD-10-CM | POA: Diagnosis not present

## 2014-10-11 DIAGNOSIS — K219 Gastro-esophageal reflux disease without esophagitis: Secondary | ICD-10-CM | POA: Insufficient documentation

## 2014-10-11 DIAGNOSIS — I10 Essential (primary) hypertension: Secondary | ICD-10-CM | POA: Insufficient documentation

## 2014-10-11 LAB — BASIC METABOLIC PANEL
ANION GAP: 8 (ref 5–15)
BUN: 9 mg/dL (ref 6–20)
CALCIUM: 9.2 mg/dL (ref 8.9–10.3)
CO2: 26 mmol/L (ref 22–32)
Chloride: 107 mmol/L (ref 101–111)
Creatinine, Ser: 1.03 mg/dL (ref 0.61–1.24)
GFR calc Af Amer: >60 mL/min (ref >60)
GFR calc non Af Amer: >60 mL/min (ref >60)
Glucose, Bld: 123 mg/dL — ABNORMAL HIGH (ref 65–99)
Potassium: 3.4 mmol/L — ABNORMAL LOW (ref 3.5–5.1)
Sodium: 141 mmol/L (ref 135–145)

## 2014-10-11 LAB — I-STAT TROPONIN, ED: TROPONIN I, POC: 0.01 ng/mL (ref 0.00–0.08)

## 2014-10-11 LAB — TROPONIN I: Troponin I: <0.03 ng/mL (ref <0.031)

## 2014-10-11 LAB — CBC
HCT: 40 % (ref 39.0–52.0)
Hemoglobin: 12.6 g/dL — ABNORMAL LOW (ref 13.0–17.0)
MCH: 26.4 pg (ref 26.0–34.0)
MCHC: 31.5 g/dL (ref 30.0–36.0)
MCV: 83.9 fL (ref 78.0–100.0)
Platelets: 308 10*3/uL (ref 150–400)
RBC: 4.77 MIL/uL (ref 4.22–5.81)
RDW: 12.9 % (ref 11.5–15.5)
WBC: 12.6 10*3/uL — AB (ref 4.0–10.5)

## 2014-10-11 LAB — BRAIN NATRIURETIC PEPTIDE: B Natriuretic Peptide: 4.6 pg/mL (ref 0.0–100.0)

## 2014-10-11 MED ORDER — ASPIRIN EC 325 MG PO TBEC
325.0000 mg | DELAYED_RELEASE_TABLET | Freq: Once | ORAL | Status: AC
  Administered 2014-10-11: 325 mg via ORAL
  Filled 2014-10-11: qty 1

## 2014-10-11 MED ORDER — ASPIRIN EC 325 MG PO TBEC: 325.0000 mg | DELAYED_RELEASE_TABLET | Freq: Every day | ORAL | Status: DC

## 2014-10-11 NOTE — Discharge Instructions (Signed)
Chest Pain Observation  It is often hard to give a specific diagnosis for the cause of chest pain. Among other possibilities your symptoms might be caused by inadequate oxygen delivery to your heart (angina). Angina that is not treated or evaluated can lead to a heart attack (myocardial infarction) or death.  Blood tests, electrocardiograms, and X-rays may have been done to help determine a possible cause of your chest pain. After evaluation and observation, your health care provider has determined that it is unlikely your pain was caused by an unstable condition that requires hospitalization. However, a full evaluation of your pain may need to be completed, with additional diagnostic testing as directed. It is very important to keep your follow-up appointments. Not keeping your follow-up appointments could result in permanent heart damage, disability, or death. If there is any problem keeping your follow-up appointments, you must call your health care provider.  HOME CARE INSTRUCTIONS   Due to the slight chance that your pain could be angina, it is important to follow your health care provider's treatment plan and also maintain a healthy lifestyle:  · Maintain or work toward achieving a healthy weight.  · Stay physically active and exercise regularly.  · Decrease your salt intake.  · Eat a balanced, healthy diet. Talk to a dietitian to learn about heart-healthy foods.  · Increase your fiber intake by including whole grains, vegetables, fruits, and nuts in your diet.  · Avoid situations that cause stress, anger, or depression.  · Take medicines as advised by your health care provider. Report any side effects to your health care provider. Do not stop medicines or adjust the dosages on your own.  · Quit smoking. Do not use nicotine patches or gum until you check with your health care provider.  · Keep your blood pressure, blood sugar, and cholesterol levels within normal limits.  · Limit alcohol intake to no more than  1 drink per day for women who are not pregnant and 2 drinks per day for men.  · Do not abuse drugs.  SEEK IMMEDIATE MEDICAL CARE IF:  You have severe chest pain or pressure which may include symptoms such as:  · You feel pain or pressure in your arms, neck, jaw, or back.  · You have severe back or abdominal pain, feel sick to your stomach (nauseous), or throw up (vomit).  · You are sweating profusely.  · You are having a fast or irregular heartbeat.  · You feel short of breath while at rest.  · You notice increasing shortness of breath during rest, sleep, or with activity.  · You have chest pain that does not get better after rest or after taking your usual medicine.  · You wake from sleep with chest pain.  · You are unable to sleep because you cannot breathe.  · You develop a frequent cough or you are coughing up blood.  · You feel dizzy, faint, or experience extreme fatigue.  · You develop severe weakness, dizziness, fainting, or chills.  Any of these symptoms may represent a serious problem that is an emergency. Do not wait to see if the symptoms will go away. Call your local emergency services (911 in the [place] Do not drive yourself to the hospital.  MAKE SURE YOU:  · Understand these instructions.  · Will watch your condition.  · Will get help right away if you are not doing well or get worse.  Document Released: 05/09/2010 Document Revised: 04/11/2013 Document Reviewed: 10/06/2012    [employer]® Patient Information ©2015 [employer]. This information is not intended to replace advice given to you by your health care provider. Make sure you discuss any questions you have with your health care provider.

## 2014-10-11 NOTE — ED Provider Notes (Signed)
CSN: [id number]     Arrival date & time 10/11/14  0121 History  This chart was scribed for Kyle Flemings, MD by Rayfield Citizen, ED Scribe. This patient was seen in room A01C/A01C and the patient's care was started at 2:51 AM.    Chief Complaint  Patient presents with  . Chest Pain   The history is provided by the patient. No language interpreter was used.     HPI Comments: Kyle Pham is a 49 y.o. male with past medical history of HTN, HLD, GERD who presents to the Emergency Department complaining of left-sided chest pain, beginning around 22:00, with associated pain, numbness, and paresthesias in the left arm. He also notes SOB and discomfort with deep breathing. He does not describe his chest pain as a "pressure." Patient explains his symptoms lasted approximately three hours before resolving without treatment. He notes prior experience with acid reflux and believed tonight's symptoms might be attributed to this; when his symptoms did not abate as expected, he came to the ED.  He denies diaphoresis.   Patient states he was seen by a cardiologist and had a stress test done in 2013; he was counseled by Dr. Doylene Canard to begin HTN medication at that time, though patient reports his PCP disagreed. He does not take blood pressure medication. Patient denies familial history of cardiac issues. He is a former .5ppd smoker (quit date 2013).   Past Medical History  Diagnosis Date  . No pertinent past medical history Feb 17, 2012     none at this time  . GERD (gastroesophageal reflux disease)   . Hypertension   . Hyperlipidemia   . Back pain   . Pain management    Past Surgical History  Procedure Laterality Date  . Tonsillectomy  49 years old  . Wisdom tooth extraction  49 years old   Family History  Problem Relation Age of Onset  . Diabetes Mother   . Hypertension Mother   . Glaucoma Mother   . Kidney disease Mother    History  Substance Use Topics  . Smoking status: Former Smoker -- 0.50  packs/day for 32 years    Types: Cigarettes    Quit date: 07/18/2011  . Smokeless tobacco: Never Used  . Alcohol Use: No    Review of Systems  Constitutional: Negative for diaphoresis.  Respiratory: Positive for shortness of breath.   Cardiovascular: Positive for chest pain.  Neurological: Positive for numbness.  All other systems reviewed and are negative.     Allergies  Review of patient's allergies indicates no known allergies.  Home Medications   Prior to Admission medications   Medication Sig Start Date End Date Taking? Authorizing Provider  cetirizine (ZYRTEC) 10 MG tablet Take 10 mg by mouth daily.    Historical Provider, MD  dorzolamide-timolol (COSOPT) 22.3-6.8 MG/ML ophthalmic solution Place 1 drop into both eyes 2 (two) times daily.    Historical Provider, MD  fluticasone (FLONASE) 50 MCG/ACT nasal spray Place 2 sprays into both nostrils daily.     Historical Provider, MD  gabapentin (NEURONTIN) 300 MG capsule Take 300 mg by mouth 3 (three) times daily.    Historical Provider, MD  gemfibrozil (LOPID) 600 MG tablet Take 600 mg by mouth 2 (two) times daily before a meal.    Historical Provider, MD  latanoprost (XALATAN) 0.005 % ophthalmic solution Place 1 drop into both eyes at bedtime.    Historical Provider, MD  montelukast (SINGULAIR) 10 MG tablet Take 10 mg by mouth  at bedtime.    Historical Provider, MD  naproxen sodium (ANAPROX) 220 MG tablet Take 220 mg by mouth as needed (for pain).    Historical Provider, MD  omeprazole (PRILOSEC) 20 MG capsule Take 1 capsule (20 mg total) by mouth daily. 08/24/12   John-Adam Bonk, MD   BP 130/72 mmHg  Pulse 61  Temp(Src) 98.7 F (37.1 C) (Oral)  Resp 20  Ht 5\' 11"  (1.803 m)  Wt 265 lb (120.203 kg)  BMI 36.98 kg/m2  SpO2 95% Physical Exam  Constitutional: He is oriented to person, place, and time. He appears well-developed and well-nourished. No distress.  HENT:  Head: Normocephalic and atraumatic.  Mouth/Throat:  Oropharynx is clear and moist. No oropharyngeal exudate.  Moist mucous membranes  Eyes: EOM are normal. Pupils are equal, round, and reactive to light.  Neck: Normal range of motion. Neck supple. No JVD present.  Cardiovascular: Normal rate, regular rhythm and normal heart sounds.  Exam reveals no gallop and no friction rub.   No murmur heard. Pulmonary/Chest: Effort normal and breath sounds normal. No respiratory distress. He has no wheezes. He has no rales.  Abdominal: Soft. Bowel sounds are normal. He exhibits no mass. There is no tenderness. There is no rebound and no guarding.  Musculoskeletal: Normal range of motion. He exhibits no edema.  Moves all extremities normally.   Lymphadenopathy:    He has no cervical adenopathy.  Neurological: He is alert and oriented to person, place, and time. He displays normal reflexes.  Skin: Skin is warm and dry. No rash noted.  Psychiatric: He has a normal mood and affect. His behavior is normal.  Nursing note and vitals reviewed.   ED Course  Procedures   DIAGNOSTIC STUDIES: Oxygen Saturation is 95% on RA, adequate by my interpretation.    COORDINATION OF CARE: 3:00 AM Discussed treatment plan with pt at bedside and pt agreed to plan.   Labs Review Labs Reviewed  BASIC METABOLIC PANEL - Abnormal; Notable for the following:    Potassium 3.4 (*)    Glucose, Bld 123 (*)    All other components within normal limits  CBC - Abnormal; Notable for the following:    WBC 12.6 (*)    Hemoglobin 12.6 (*)    All other components within normal limits  BRAIN NATRIURETIC PEPTIDE  TROPONIN I  I-STAT TROPOININ, ED    Imaging Review Dg Chest 2 View  10/11/2014   CLINICAL DATA:  Acute onset of left-sided chest and arm pain. Initial encounter.  EXAM: CHEST  2 VIEW  COMPARISON:  Chest radiograph performed 04/13/2014  FINDINGS: The lungs are well-aerated. Peribronchial thickening is noted. Mild vascular congestion is seen. Mildly increased interstitial  markings could reflect minimal interstitial edema. There is no evidence of pleural effusion or pneumothorax.  The heart is normal in size; the mediastinal contour is within normal limits. No acute osseous abnormalities are seen.  IMPRESSION: Peribronchial thickening noted. Mild vascular congestion seen. Mildly increased interstitial markings could reflect minimal interstitial edema.   Electronically Signed   By: Garald Balding M.D.   On: 10/11/2014 03:02     EKG Interpretation   Date/Time:  Thursday October 11 2014 01:27:57 EDT Ventricular Rate:  57 PR Interval:  146 QRS Duration: 80 QT Interval:  402 QTC Calculation: 391 R Axis:   57 Text Interpretation:  Sinus bradycardia T wave abnormality, consider  inferolateral ischemia Abnormal ECG new twave inversions  inferior/laterally Confirmed by Desarie Feild  MD, Koa Palla ([id number]) on 10/11/2014  2:28:48 AM      MDM   Final diagnoses:  Chest pain with low risk for cardiac etiology    I personally performed the services described in this documentation, which was scribed in my presence. The recorded information has been reviewed and is accurate.  49 year old male with pin pricking sensation to chest and left arm at 10 PM tonight, symptoms lasted 3 hours before resolving.  No pain at present.  Patient has history of chest pain the past with negative stress test 2013.  Patient's history of ReFlex, hypertension, but he denies this and is not a medication for hypertension.  No family history of coronary disease.  Patient has heart score of 3     Kyle Flemings, MD 10/11/14 [phone]

## 2014-10-11 NOTE — ED Notes (Signed)
Pt reports left sided chest pain radiating to left arm starting around 2200 tonight. Pt reports some shortness of breath with the pain.

## 2015-04-02 ENCOUNTER — Ambulatory Visit (HOSPITAL_BASED_OUTPATIENT_CLINIC_OR_DEPARTMENT_OTHER): Payer: Medicare Other | Attending: Anesthesiology

## 2015-04-11 ENCOUNTER — Encounter (HOSPITAL_COMMUNITY): Payer: Self-pay | Admitting: Emergency Medicine

## 2015-04-11 ENCOUNTER — Emergency Department (HOSPITAL_COMMUNITY)
Admission: EM | Admit: 2015-04-11 | Discharge: 2015-04-11 | Disposition: A | Discharge status: Home / Self Care | Payer: Medicare Other | Attending: Emergency Medicine | Admitting: Emergency Medicine

## 2015-04-11 ENCOUNTER — Emergency Department (HOSPITAL_COMMUNITY): Payer: Medicare Other

## 2015-04-11 DIAGNOSIS — S8001XA Contusion of right knee, initial encounter: Secondary | ICD-10-CM | POA: Diagnosis not present

## 2015-04-11 DIAGNOSIS — Y990 Civilian activity done for income or pay: Secondary | ICD-10-CM | POA: Diagnosis not present

## 2015-04-11 DIAGNOSIS — Z79899 Other long term (current) drug therapy: Secondary | ICD-10-CM | POA: Diagnosis not present

## 2015-04-11 DIAGNOSIS — S20219A Contusion of unspecified front wall of thorax, initial encounter: Secondary | ICD-10-CM | POA: Insufficient documentation

## 2015-04-11 DIAGNOSIS — Z87891 Personal history of nicotine dependence: Secondary | ICD-10-CM | POA: Diagnosis not present

## 2015-04-11 DIAGNOSIS — Y9389 Activity, other specified: Secondary | ICD-10-CM | POA: Diagnosis not present

## 2015-04-11 DIAGNOSIS — S29001A Unspecified injury of muscle and tendon of front wall of thorax, initial encounter: Secondary | ICD-10-CM | POA: Diagnosis present

## 2015-04-11 DIAGNOSIS — Y92410 Unspecified street and highway as the place of occurrence of the external cause: Secondary | ICD-10-CM | POA: Insufficient documentation

## 2015-04-11 DIAGNOSIS — I10 Essential (primary) hypertension: Secondary | ICD-10-CM | POA: Diagnosis not present

## 2015-04-11 DIAGNOSIS — Z791 Long term (current) use of non-steroidal anti-inflammatories (NSAID): Secondary | ICD-10-CM | POA: Insufficient documentation

## 2015-04-11 DIAGNOSIS — K219 Gastro-esophageal reflux disease without esophagitis: Secondary | ICD-10-CM | POA: Diagnosis not present

## 2015-04-11 DIAGNOSIS — E785 Hyperlipidemia, unspecified: Secondary | ICD-10-CM | POA: Insufficient documentation

## 2015-04-11 MED ORDER — IBUPROFEN 800 MG PO TABS: 800.0000 mg | ORAL_TABLET | Freq: Three times a day (TID) | ORAL | Status: DC

## 2015-04-11 MED ORDER — CYCLOBENZAPRINE HCL 10 MG PO TABS: 10.0000 mg | ORAL_TABLET | Freq: Two times a day (BID) | ORAL | Status: DC | PRN

## 2015-04-11 MED ORDER — OXYCODONE-ACETAMINOPHEN 5-325 MG PO TABS: 1.0000 | ORAL_TABLET | ORAL | Status: DC | PRN

## 2015-04-11 MED ORDER — OXYCODONE-ACETAMINOPHEN 5-325 MG PO TABS
2.0000 | ORAL_TABLET | Freq: Once | ORAL | Status: AC
  Administered 2015-04-11: 2 via ORAL
  Filled 2015-04-11: qty 2
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
## 2015-04-11 NOTE — Discharge Instructions (Signed)
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
## 2015-10-17 ENCOUNTER — Telehealth: Payer: Self-pay

## 2015-10-17 ENCOUNTER — Institutional Professional Consult (permissible substitution): Payer: Medicare Other | Admitting: Neurology

## 2015-10-17 NOTE — Telephone Encounter (Signed)
Patient did not show to new appt

## 2015-10-18 ENCOUNTER — Encounter: Payer: Self-pay | Admitting: Neurology

## 2015-11-01 ENCOUNTER — Ambulatory Visit (INDEPENDENT_AMBULATORY_CARE_PROVIDER_SITE_OTHER): Payer: Medicare Other | Admitting: Allergy and Immunology

## 2015-11-01 ENCOUNTER — Encounter: Payer: Self-pay | Admitting: Allergy and Immunology

## 2015-11-01 VITALS — BP 140/90 | HR 70 | Temp 97.9°F | Resp 16 | Ht 69.4 in | Wt 263.0 lb

## 2015-11-01 DIAGNOSIS — J3089 Other allergic rhinitis: Secondary | ICD-10-CM

## 2015-11-01 DIAGNOSIS — J321 Chronic frontal sinusitis: Secondary | ICD-10-CM

## 2015-11-01 DIAGNOSIS — R059 Cough, unspecified: Secondary | ICD-10-CM

## 2015-11-01 DIAGNOSIS — R05 Cough: Secondary | ICD-10-CM | POA: Diagnosis not present

## 2015-11-01 MED ORDER — AZELASTINE HCL 0.15 % NA SOLN: 2.0000 | Freq: Two times a day (BID) | NASAL | Status: DC

## 2015-11-01 NOTE — Assessment & Plan Note (Signed)
   Treatment plan as outlined above.  If symptoms persist or progress, we will proceed to imaging studies or otolaryngology referral.

## 2015-11-01 NOTE — Assessment & Plan Note (Signed)
The patient's history and physical examination suggest upper airway cough syndrome.  Spirometry today reveals normal ventilatory function. We will aggressively treat postnasal drainage and evaluate results.  Treatment plan as outlined above.  If the coughing persists or progresses despite this plan, we will evaluate further. 

## 2015-11-01 NOTE — Assessment & Plan Note (Deleted)
The patient's history and physical examination suggest upper airway cough syndrome.  Spirometry today reveals normal ventilatory function. We will aggressively treat postnasal drainage and evaluate results.  Treatment plan as outlined above.  If the coughing persists or progresses despite this plan, we will evaluate further. 

## 2015-11-01 NOTE — Patient Instructions (Addendum)
Perennial allergic rhinitis  Aeroallergen avoidance measures have been discussed and provided in written form.  A prescription has been provided for azelastine nasal spray, 1-2 sprays per nostril 2 times daily as needed. Proper nasal spray technique has been discussed and demonstrated.   Continue fluticasone nasal spray if needed.  For now, continue montelukast 10 mg daily at bedtime.  Nasal saline lavage (NeilMed) as needed has been recommended along with instructions for proper administration.  Guaifenesin 1200 mg twice daily as needed with adequate hydration as discussed.   If allergen avoidance measures and medications fail to adequately relieve symptoms, aeroallergen immunotherapy will be considered.  Coughing The patient's history and physical examination suggest upper airway cough syndrome.  Spirometry today reveals normal ventilatory function. We will aggressively treat postnasal drainage and evaluate results.  Treatment plan as outlined above.  If the coughing persists or progresses despite this plan, we will evaluate further.    Return in about 4 months (around 03/03/2016), or if symptoms worsen or fail to improve.  Control of House Dust Mite Allergen  House dust mites play a major role in allergic asthma and rhinitis.  They occur in environments with high humidity wherever human skin, the food for dust mites is found. High levels have been detected in dust obtained from mattresses, pillows, carpets, upholstered furniture, bed covers, clothes and soft toys.  The principal allergen of the house dust mite is found in its feces.  A gram of dust may contain 1,000 mites and 250,000 fecal particles.  Mite antigen is easily measured in the air during house cleaning activities.    1. Encase mattresses, including the box spring, and pillow, in an air tight cover.  Seal the zipper end of the encased mattresses with wide adhesive tape. 2. Wash the bedding in water of 130 degrees  Farenheit weekly.  Avoid cotton comforters/quilts and flannel bedding: the most ideal bed covering is the dacron comforter. 3. Remove all upholstered furniture from the bedroom. 4. Remove carpets, carpet padding, rugs, and non-washable window drapes from the bedroom.  Wash drapes weekly or use plastic window coverings. 5. Remove all non-washable stuffed toys from the bedroom.  Wash stuffed toys weekly. 6. Have the room cleaned frequently with a vacuum cleaner and a damp dust-mop.  The patient should not be in a room which is being cleaned and should wait 1 hour after cleaning before going into the room. 7. Close and seal all heating outlets in the bedroom.  Otherwise, the room will become filled with dust-laden air.  An electric heater can be used to heat the room. 8. Reduce indoor humidity to less than 50%.  Do not use a humidifier.  Control of Dog or Cat Allergen  Avoidance is the best way to manage a dog or cat allergy. If you have a dog or cat and are allergic to dog or cats, consider removing the dog or cat from the home. If you have a dog or cat but don't want to find it a new home, or if your family wants a pet even though someone in the household is allergic, here are some strategies that may help keep symptoms at bay:  1. Keep the pet out of your bedroom and restrict it to only a few rooms. Be advised that keeping the dog or cat in only one room will not limit the allergens to that room. 2. Don't pet, hug or kiss the dog or cat; if you do, wash your hands with soap and  water. 3. High-efficiency particulate air (HEPA) cleaners run continuously in a bedroom or living room can reduce allergen levels over time. 4. Regular use of a high-efficiency vacuum cleaner or a central vacuum can reduce allergen levels. 5. Giving your dog or cat a bath at least once a week can reduce airborne allergen.  Control of Cockroach Allergen  Cockroach allergen has been identified as an important cause of acute  attacks of asthma, especially in urban settings.  There are fifty-five species of cockroach that exist in the [place], however only three, the [place], [place] species produce allergen that can affect patients with Asthma.  Allergens can be obtained from fecal particles, egg casings and secretions from cockroaches.    1. Remove food sources. 2. Reduce access to water. 3. Seal access and entry points. 4. Spray runways with 0.5-1% Diazinon or Chlorpyrifos 5. Blow boric acid power under stoves and refrigerator. 6. Place bait stations (hydramethylnon) at feeding sites.

## 2015-11-01 NOTE — Progress Notes (Signed)
New Patient Note  RE: Kyle Pham MRN: [id number] DOB: [birth year] Date of Office Visit: 11/01/2015  Referring provider: Saralyn Pilar, MD Primary care provider: Suzanna Obey, MD  Chief Complaint: Nasal Congestion; Sinus Problem; and Cough   History of present illness: HPI Comments: Kyle Pham is a 50 y.o. male presenting today for consultation of rhinosinusitis and cough.  He reports that since moving from [place] to [place] in 2005 he has experienced progressively increasing nasal and sinus symptoms, including nasal congestion, rhinorrhea, postnasal drainage, and sinus pressure.  Cetirizine, montelukast, fluticasone, and Pataday eyedrops have not provided adequate symptom relief.  He also complains of coughing, particularly in the morning.  The cough feels like it originates from the base of the throat and he believes that it may be related to thick postnasal drainage.  He has gastroesophageal reflux disease which is relatively well-controlled with omeprazole.   Assessment and plan: Perennial allergic rhinitis  Aeroallergen avoidance measures have been discussed and provided in written form.  A prescription has been provided for azelastine nasal spray, 1-2 sprays per nostril 2 times daily as needed. Proper nasal spray technique has been discussed and demonstrated.   Continue fluticasone nasal spray if needed.  For now, continue montelukast 10 mg daily at bedtime.  Nasal saline lavage (NeilMed) as needed has been recommended along with instructions for proper administration.  Guaifenesin 1200 mg twice daily as needed with adequate hydration as discussed.   If allergen avoidance measures and medications fail to adequately relieve symptoms, aeroallergen immunotherapy will be considered.  Chronic sinusitis  Treatment plan as outlined above.  If symptoms persist or progress, we will proceed to imaging studies or otolaryngology referral.  Coughing The  patient's history and physical examination suggest upper airway cough syndrome.  Spirometry today reveals normal ventilatory function. We will aggressively treat postnasal drainage and evaluate results.  Treatment plan as outlined above.  If the coughing persists or progresses despite this plan, we will evaluate further.    Diagnositics: Spirometry:  Normal with an FEV1 of 89% predicted.  Please see scanned spirometry results for details. Epicutaneous testing: Positive to dust mite antigen. Intradermal testing: Positive to cat hair and cockroach antigen.    Physical examination: Blood pressure 140/90, pulse 70, temperature 97.9 F (36.6 C), temperature source Oral, resp. rate 16, height 5' 9.4" (1.763 m), weight 263 lb (119.296 kg), SpO2 95 %.  General: Alert, interactive, in no acute distress. HEENT: TMs pearly gray, turbinates edematous without discharge, post-pharynx erythematous. Neck: Supple without lymphadenopathy. Lungs: Clear to auscultation without wheezing, rhonchi or rales. CV: Normal S1, S2 without murmurs. Abdomen: Nondistended, nontender. Skin: Warm and dry, without lesions or rashes. Extremities:  No clubbing, cyanosis or edema. Neuro:   Grossly intact.  Review of systems:  Review of Systems  Constitutional: Negative for fever, chills and weight loss.  HENT: Positive for congestion. Negative for nosebleeds.   Eyes: Negative for blurred vision.  Respiratory: Positive for cough. Negative for hemoptysis.   Cardiovascular: Negative for chest pain.  Gastrointestinal: Negative for diarrhea and constipation.  Genitourinary: Negative for dysuria.  Musculoskeletal: Negative for myalgias and joint pain.  Skin: Positive for itching.  Neurological: Positive for headaches. Negative for dizziness.  Endo/Heme/Allergies: Positive for environmental allergies. Does not bruise/bleed easily.    Past medical history:  Past Medical History  Diagnosis Date  . No pertinent past  medical history Feb 17, 2012     none at this time  . GERD (gastroesophageal reflux  disease)   . Hypertension   . Hyperlipidemia   . Back pain   . Pain management     Past surgical history:  Past Surgical History  Procedure Laterality Date  . Tonsillectomy  50 years old  . Wisdom tooth extraction  50 years old    Family history: Family History  Problem Relation Age of Onset  . Diabetes Mother   . Hypertension Mother   . Glaucoma Mother   . Kidney disease Mother   . Allergic rhinitis Neg Hx   . Angioedema Neg Hx   . Asthma Neg Hx   . Atopy Neg Hx   . Eczema Neg Hx   . Immunodeficiency Neg Hx   . Urticaria Neg Hx     Social history: Social History   Social History  . Marital Status: Married    Spouse Name: N/A  . Number of Children: N/A  . Years of Education: N/A   Occupational History  . Not on file.   Social History Main Topics  . Smoking status: Former Smoker -- 0.50 packs/day for 32 years    Types: Cigarettes    Quit date: 07/18/2011  . Smokeless tobacco: Never Used  . Alcohol Use: No  . Drug Use: No  . Sexual Activity: Not on file   Other Topics Concern  . Not on file   Social History Narrative   Environmental History: The patient lives in a 49 year old house with carpeting throughout and central air/heat.  There are dogs in house which do not have access to her bedroom.  She is a nonsmoker.    Medication List       This list is accurate as of: 11/01/15  4:06 PM.  Always use your most recent med list.               albuterol 108 (90 Base) MCG/ACT inhaler  Commonly known as:  PROVENTIL HFA;VENTOLIN HFA  Inhale 1-2 puffs into the lungs every 6 (six) hours as needed for wheezing or shortness of breath.     Azelastine HCl 0.15 % Soln  Place 2 sprays into both nostrils 2 (two) times daily.     brimonidine 0.1 % Soln  Commonly known as:  ALPHAGAN P  Place 1 drop into the right eye 3 (three) times daily.     cetirizine 10 MG tablet    Commonly known as:  ZYRTEC  Take 10 mg by mouth daily.     cyclobenzaprine 10 MG tablet  Commonly known as:  FLEXERIL  Take 1 tablet (10 mg total) by mouth 2 (two) times daily as needed for muscle spasms.     dorzolamide-timolol 22.3-6.8 MG/ML ophthalmic solution  Commonly known as:  COSOPT  Place 1 drop into both eyes 2 (two) times daily.     fluticasone 50 MCG/ACT nasal spray  Commonly known as:  FLONASE  Place 2 sprays into both nostrils daily as needed for allergies or rhinitis.     gabapentin 300 MG capsule  Commonly known as:  NEURONTIN  Take 300 mg by mouth 3 (three) times daily as needed (pain). Reported on 11/01/2015     gemfibrozil 600 MG tablet  Commonly known as:  LOPID  Take 600 mg by mouth 2 (two) times daily before a meal.     ibuprofen 800 MG tablet  Commonly known as:  ADVIL,MOTRIN  Take 1 tablet (800 mg total) by mouth 3 (three) times daily.     latanoprost 0.005 % ophthalmic solution  Commonly known as:  XALATAN  Place 1 drop into both eyes at bedtime.     montelukast 10 MG tablet  Commonly known as:  SINGULAIR  Take 10 mg by mouth at bedtime.     naproxen sodium 220 MG tablet  Commonly known as:  ANAPROX  Take 220 mg by mouth daily as needed (for pain). Reported on 11/01/2015     omeprazole 20 MG capsule  Commonly known as:  PRILOSEC  Take 1 capsule (20 mg total) by mouth daily.     oxyCODONE-acetaminophen 5-325 MG tablet  Commonly known as:  PERCOCET  Take 1 tablet by mouth every 4 (four) hours as needed.     PATADAY 0.2 % Soln  Generic drug:  Olopatadine HCl  Place 1 drop into both eyes daily as needed (allergies).        Known medication allergies: No Known Allergies  I appreciate the opportunity to take part in Tyreon's care. Please do not hesitate to contact me with questions.  Sincerely,   R. Edgar Frisk, MD

## 2015-11-01 NOTE — Assessment & Plan Note (Addendum)
   Aeroallergen avoidance measures have been discussed and provided in written form.  A prescription has been provided for azelastine nasal spray, 1-2 sprays per nostril 2 times daily as needed. Proper nasal spray technique has been discussed and demonstrated.   Continue fluticasone nasal spray if needed.  For now, continue montelukast 10 mg daily at bedtime.  Nasal saline lavage (NeilMed) as needed has been recommended along with instructions for proper administration.  Guaifenesin 1200 mg twice daily as needed with adequate hydration as discussed.   If allergen avoidance measures and medications fail to adequately relieve symptoms, aeroallergen immunotherapy will be considered.

## 2015-11-05 ENCOUNTER — Ambulatory Visit (INDEPENDENT_AMBULATORY_CARE_PROVIDER_SITE_OTHER): Payer: Medicare Other | Admitting: Neurology

## 2015-11-05 ENCOUNTER — Encounter: Payer: Self-pay | Admitting: Neurology

## 2015-11-05 VITALS — BP 135/86 | HR 54 | Resp 18 | Ht 70.0 in | Wt 266.0 lb

## 2015-11-05 DIAGNOSIS — G478 Other sleep disorders: Secondary | ICD-10-CM

## 2015-11-05 DIAGNOSIS — R0683 Snoring: Secondary | ICD-10-CM

## 2015-11-05 DIAGNOSIS — G4710 Hypersomnia, unspecified: Secondary | ICD-10-CM | POA: Diagnosis not present

## 2015-11-05 DIAGNOSIS — E669 Obesity, unspecified: Secondary | ICD-10-CM | POA: Diagnosis not present

## 2015-11-05 DIAGNOSIS — G479 Sleep disorder, unspecified: Secondary | ICD-10-CM | POA: Diagnosis not present

## 2015-11-05 NOTE — Patient Instructions (Signed)

## 2015-11-05 NOTE — Progress Notes (Signed)
Subjective:    Patient ID: Kyle Pham is a 50 y.o. male.  HPI     Star Age, MD, PhD [facility] [address] [place], [place] [address]  Dear Dr. Doreene Nest,   I saw your patient, Kyle Pham, upon your kind request in my neurologic clinic today for initial consultation of his sleep disorder, in particular, concern for underlying obstructive sleep apnea. The patient is unaccompanied today. Of note, the patient no showed for an appointment on 10/17/2015. As you know, Kyle Pham is a 50 year old right-handed gentleman with an underlying medical history of allergic rhinitis, dyslipidemia, glaucoma, obesity, and chronic back pain, on narcotic pain medication until about 1 month ago (was followed by pain management), who reports snoring and excessive daytime somnolence. I reviewed your office note from 10/02/2015. He no longer sees pain management, has been off of oxycodone and off of gabapentin. He has applied for a job driving a [employer] bus. He has previously worked as a Secondary school teacher. He has a remote Hx of 3rd shift work in 2011 and 2012.   His bedtime varies, usually he tries to be in bed between 11 PM and midnight. He does not have any trouble falling asleep, wake time is around 6 AM, he does not typically wake up rested. Epworth sleepiness score is 11 out of 24 today, fatigue score is 20 out of 63. He denies any restless leg symptoms. He has numbness occasionally in his left leg in the thigh, laterally, sometimes moves excessively and sleep and has kicked his leg on the left while asleep per wife's report. He is not aware of any obstructive sleep apnea and his family. He has a history of low back pain. He has 3 grown children, ages 40, 44 and 44. He lives at home with his wife and 2 younger children. He quit smoking about 5 years ago, drinks beer occasionally and soda occasionally maybe up to 3 times a week. He has recently started seeing  an allergy specialist and has been started on allergy medication and nasal spray. He denies morning headaches or nocturia on a night to night basis. He had laser eye surgery to both eyes and within the last 3 weeks, under Dr. Katy Fitch.  His Past Medical History Is Significant For: Past Medical History  Diagnosis Date  . No pertinent past medical history Feb 17, 2012     none at this time  . GERD (gastroesophageal reflux disease)   . Hypertension   . Hyperlipidemia   . Back pain   . Pain management     His Past Surgical History Is Significant For: Past Surgical History  Procedure Laterality Date  . Tonsillectomy  50 years old  . Wisdom tooth extraction  50 years old    His Family History Is Significant For: Family History  Problem Relation Age of Onset  . Diabetes Mother   . Hypertension Mother   . Glaucoma Mother   . Kidney disease Mother   . Allergic rhinitis Neg Hx   . Angioedema Neg Hx   . Asthma Neg Hx   . Atopy Neg Hx   . Eczema Neg Hx   . Immunodeficiency Neg Hx   . Urticaria Neg Hx   . Liver cancer Brother     His Social History Is Significant For: Social History   Social History  . Marital Status: Married    Spouse Name: N/A  . Number of Children: 3  . Years  of Education: 12   Occupational History  . N/A    Social History Main Topics  . Smoking status: Former Smoker -- 0.50 packs/day for 32 years    Types: Cigarettes    Quit date: 07/18/2011  . Smokeless tobacco: Never Used  . Alcohol Use: 0.0 oz/week    0 Standard drinks or equivalent per week  . Drug Use: No  . Sexual Activity: Not Asked   Other Topics Concern  . None   Social History Narrative   Caffeine: occasional soda     His Allergies Are:  No Known Allergies:   His Current Medications Are:  Outpatient Encounter Prescriptions as of 11/05/2015  Medication Sig  . cetirizine (ZYRTEC) 10 MG tablet Take 10 mg by mouth daily.  . dorzolamide-timolol (COSOPT) 22.3-6.8 MG/ML ophthalmic  solution Place 1 drop into both eyes 2 (two) times daily.  Marland Kitchen gemfibrozil (LOPID) 600 MG tablet Take 600 mg by mouth 2 (two) times daily before a meal.  . latanoprost (XALATAN) 0.005 % ophthalmic solution Place 1 drop into both eyes at bedtime.  . montelukast (SINGULAIR) 10 MG tablet Take 10 mg by mouth at bedtime.  Marland Kitchen omeprazole (PRILOSEC) 20 MG capsule Take 1 capsule (20 mg total) by mouth daily.  . [DISCONTINUED] albuterol (PROVENTIL HFA;VENTOLIN HFA) 108 (90 BASE) MCG/ACT inhaler Inhale 1-2 puffs into the lungs every 6 (six) hours as needed for wheezing or shortness of breath.  . [DISCONTINUED] Azelastine HCl 0.15 % SOLN Place 2 sprays into both nostrils 2 (two) times daily.  . [DISCONTINUED] brimonidine (ALPHAGAN P) 0.1 % SOLN Place 1 drop into the right eye 3 (three) times daily.  . [DISCONTINUED] cyclobenzaprine (FLEXERIL) 10 MG tablet Take 1 tablet (10 mg total) by mouth 2 (two) times daily as needed for muscle spasms. (Patient not taking: Reported on 11/05/2015)  . [DISCONTINUED] fluticasone (FLONASE) 50 MCG/ACT nasal spray Place 2 sprays into both nostrils daily as needed for allergies or rhinitis.   . [DISCONTINUED] gabapentin (NEURONTIN) 300 MG capsule Take 300 mg by mouth 3 (three) times daily as needed (pain). Reported on 11/01/2015  . [DISCONTINUED] ibuprofen (ADVIL,MOTRIN) 800 MG tablet Take 1 tablet (800 mg total) by mouth 3 (three) times daily. (Patient not taking: Reported on 11/01/2015)  . [DISCONTINUED] naproxen sodium (ANAPROX) 220 MG tablet Take 220 mg by mouth daily as needed (for pain). Reported on 11/01/2015  . [DISCONTINUED] Olopatadine HCl (PATADAY) 0.2 % SOLN Place 1 drop into both eyes daily as needed (allergies).   . [DISCONTINUED] oxyCODONE-acetaminophen (PERCOCET) 5-325 MG tablet Take 1 tablet by mouth every 4 (four) hours as needed. (Patient not taking: Reported on 11/01/2015)   No facility-administered encounter medications on file as of 11/05/2015.  :  Review of  Systems:  Out of a complete 14 point review of systems, all are reviewed and negative with the exception of these symptoms as listed below:   Review of Systems  Neurological:       Patient feels like he gets restless sleep, reported snoring, has woken up choking, wakes up feeling tired, and has daytime tiredness.    Epworth Sleepiness Scale 0= would never doze 1= slight chance of dozing 2= moderate chance of dozing 3= high chance of dozing  Sitting and reading:2 Watching TV:1 Sitting inactive in a public place (ex. Theater or meeting):1 As a passenger in a car for an hour without a break:3 Lying down to rest in the afternoon:3 Sitting and talking to someone:0 Sitting quietly after lunch (no alcohol):1  In a car, while stopped in traffic:0 Total:11  Objective:  Neurologic Exam  Physical Exam Physical Examination:   Filed Vitals:   11/05/15 1006  BP: 135/86  Pulse: 54  Resp: 18    General Examination: The patient is a very pleasant 50 y.o. male in no acute distress. He appears well-developed and well-nourished and well groomed.   HEENT: Normocephalic, atraumatic, pupils are equal, round and reactive to light and accommodation. Funduscopic exam is normal with sharp disc margins noted. Extraocular tracking is good without limitation to gaze excursion or nystagmus noted. Normal smooth pursuit is noted. Hearing is grossly intact. He had recent laser eye surgery on both eyes within the last 3 weeks he reports. Face is symmetric with normal facial animation and normal facial sensation. Speech is clear with no dysarthria noted. There is no hypophonia. There is no lip, neck/head, jaw or voice tremor. Neck is supple with full range of passive and active motion. There are no carotid bruits on auscultation. Oropharynx exam reveals: moderate mouth dryness, adequate dental hygiene and moderate airway crowding, due to redundant soft palate, large uvula, tip of uvula was not visualized, tonsils  are absent, Mallampati is class II. Tongue protrudes centrally and palate elevates symmetrically. Neck size is 19.75 inches. He has a Mild overbite. Nasal inspection reveals mild nasal mucosal bogginess, no septal deviation.   Chest: Clear to auscultation without wheezing, rhonchi or crackles noted.  Heart: S1+S2+0, regular and normal without murmurs, rubs or gallops noted.   Abdomen: Soft, non-tender and non-distended with normal bowel sounds appreciated on auscultation.  Extremities: There is no pitting edema in the distal lower extremities bilaterally. Pedal pulses are intact.  Skin: Warm and dry without trophic changes noted. There are no varicose veins.  Musculoskeletal: exam reveals no obvious joint deformities, tenderness or joint swelling or erythema.   Neurologically:  Mental status: The patient is awake, alert and oriented in all 4 spheres. His immediate and remote memory, attention, language skills and fund of knowledge are appropriate. There is no evidence of aphasia, agnosia, apraxia or anomia. Speech is clear with normal prosody and enunciation. Thought process is linear. Mood is normal and affect is normal.  Cranial nerves II - XII are as described above under HEENT exam. In addition: shoulder shrug is normal with equal shoulder height noted. Motor exam: Normal bulk, strength and tone is noted. There is no drift, tremor or rebound. Romberg is negative. Reflexes are 2+ throughout. Fine motor skills and coordination: intact with normal finger taps, normal hand movements, normal rapid alternating patting, normal foot taps and normal foot agility.  Cerebellar testing: No dysmetria or intention tremor on finger to nose testing. Heel to shin is unremarkable bilaterally. There is no truncal or gait ataxia.  Sensory exam: intact to light touch, pinprick, vibration, temperature sense in the upper and lower extremities, but he reports an area of numbness in the left lateral thigh, could be in  keeping with meralgia paresthetica. Gait, station and balance: He stands with mild difficulty. No veering to one side is noted. No leaning to one side is noted. Posture is age-appropriate and stance is narrow based. Gait shows normal stride length and normal pace. No problems turning are noted. Tandem walk is slightly challanging for him, but doable.                Assessment and plan:   In summary, Kyle Pham is a very pleasant 50 y.o.-year old male with an underlying medical history of  allergic rhinitis, dyslipidemia, glaucoma, obesity, and chronic back pain, on narcotic pain medication until about a month ago, whose history and physical exam are indeed concerning for obstructive sleep apnea (OSA). I had a long chat with the patient about my findings and the diagnosis of OSA, its prognosis and treatment options. We talked about medical treatments, surgical interventions and non-pharmacological approaches. I explained in particular the risks and ramifications of untreated moderate to severe OSA, especially with respect to developing cardiovascular disease down the Road, including congestive heart failure, difficult to treat hypertension, cardiac arrhythmias, or stroke. Even type 2 diabetes has, in part, been linked to untreated OSA. Symptoms of untreated OSA include daytime sleepiness, memory problems, mood irritability and mood disorder such as depression and anxiety, lack of energy, as well as recurrent headaches, especially morning headaches. We talked about trying to maintain a healthy lifestyle in general, as well as the importance of weight control. I encouraged the patient to eat healthy, exercise daily and keep well hydrated, to keep a scheduled bedtime and wake time routine, to not skip any meals and eat healthy snacks in between meals. I advised the patient not to drive when feeling sleepy. I recommended the following at this time: sleep study with potential positive airway pressure titration.  (We will score hypopneas at 4% and split the sleep study into diagnostic and treatment portion, if the estimated. 2 hour AHI is >15h).   I explained the sleep test procedure to the patient and also outlined possible surgical and non-surgical treatment options of OSA, including the use of a custom-made dental device (which would require a referral to a specialist dentist or oral surgeon), upper airway surgical options, such as pillar implants, radiofrequency surgery, tongue base surgery, and UPPP (which would involve a referral to an ENT surgeon). Rarely, jaw surgery such as mandibular advancement may be considered.  I also explained the CPAP treatment option to the patient, who indicated that he would be willing to try CPAP if the need arises. I explained the importance of being compliant with PAP treatment, not only for insurance purposes but primarily to improve His symptoms, and for the patient's long term health benefit, including to reduce His cardiovascular risks. I answered all his questions today and the patient was in agreement. I would like to see him back after the sleep study is completed and encouraged him to call with any interim questions, concerns, problems or updates.   Thank you very much for allowing me to participate in the care of this nice patient. If I can be of any further assistance to you please do not hesitate to call me at [phone].  Sincerely,   Star Age, MD, PhD

## 2015-11-11 ENCOUNTER — Ambulatory Visit (INDEPENDENT_AMBULATORY_CARE_PROVIDER_SITE_OTHER): Payer: Medicare Other | Admitting: Neurology

## 2015-11-11 DIAGNOSIS — G4733 Obstructive sleep apnea (adult) (pediatric): Secondary | ICD-10-CM | POA: Diagnosis not present

## 2015-11-11 DIAGNOSIS — G4761 Periodic limb movement disorder: Secondary | ICD-10-CM

## 2015-11-11 DIAGNOSIS — G4720 Circadian rhythm sleep disorder, unspecified type: Secondary | ICD-10-CM

## 2015-11-11 DIAGNOSIS — G479 Sleep disorder, unspecified: Secondary | ICD-10-CM

## 2015-11-15 ENCOUNTER — Telehealth: Payer: Self-pay | Admitting: Neurology

## 2015-11-15 DIAGNOSIS — G4761 Periodic limb movement disorder: Secondary | ICD-10-CM

## 2015-11-15 DIAGNOSIS — G4720 Circadian rhythm sleep disorder, unspecified type: Secondary | ICD-10-CM

## 2015-11-15 DIAGNOSIS — G479 Sleep disorder, unspecified: Secondary | ICD-10-CM

## 2015-11-15 DIAGNOSIS — G4733 Obstructive sleep apnea (adult) (pediatric): Secondary | ICD-10-CM

## 2015-11-15 NOTE — Telephone Encounter (Signed)
Patient referred by Dr. Doreene Nest, seen by me on 11/05/15, diagnostic PSG on 11/11/15.   Please call and notify the patient that the recent sleep study did confirm the diagnosis of moderate to severe obstructive sleep apnea and that I recommend treatment for this in the form of CPAP. This will require a repeat sleep study for proper titration and mask fitting. Please explain to patient and arrange for a CPAP titration study. I have placed an order in the chart. Thanks, and please route to [facility] for scheduling next sleep study.  Star Age, MD, PhD [facility])

## 2015-11-18 NOTE — Telephone Encounter (Signed)
I spoke to patient and he is aware of results and recommendations. He is willing to proceed with titration study. I will send report to PCP.   

## 2016-02-24 ENCOUNTER — Ambulatory Visit: Payer: Medicare Other | Admitting: Allergy and Immunology

## 2016-04-27 ENCOUNTER — Encounter: Payer: Medicare Other | Admitting: Podiatry

## 2016-05-04 NOTE — Progress Notes (Signed)
This encounter was created in error - please disregard.

## 2016-05-07 ENCOUNTER — Ambulatory Visit: Payer: Medicare Other | Admitting: Podiatry

## 2016-05-14 ENCOUNTER — Encounter: Payer: Self-pay | Admitting: Podiatry

## 2016-05-14 ENCOUNTER — Ambulatory Visit (INDEPENDENT_AMBULATORY_CARE_PROVIDER_SITE_OTHER): Payer: Medicare Other | Admitting: Podiatry

## 2016-05-14 ENCOUNTER — Ambulatory Visit (INDEPENDENT_AMBULATORY_CARE_PROVIDER_SITE_OTHER): Payer: Medicare Other

## 2016-05-14 VITALS — Resp 16 | Ht 71.0 in | Wt 262.0 lb

## 2016-05-14 DIAGNOSIS — M722 Plantar fascial fibromatosis: Secondary | ICD-10-CM

## 2016-05-14 MED ORDER — TRIAMCINOLONE ACETONIDE 10 MG/ML IJ SUSP
10.0000 mg | Freq: Once | INTRAMUSCULAR | Status: AC
  Administered 2016-05-14: 10 mg

## 2016-05-14 NOTE — Progress Notes (Signed)
   Subjective:    Patient ID: Kyle Pham, male    DOB: May 28, 1965, 51 y.o.   MRN: [id number]  HPI  Chief Complaint  Patient presents with  . Foot Pain    Right; Bottom and Back of heel pain x 4 months. Left; Arch and Bottom/Back of heel x 1 year.        Review of Systems     Objective:   Physical Exam        Assessment & Plan:

## 2016-05-14 NOTE — Patient Instructions (Signed)

## 2016-05-15 NOTE — Progress Notes (Signed)
Subjective:     Patient ID: Kyle Pham, male   DOB: [birth year], 51 y.o.   MRN: [id number]  HPI patient presents stating that she's had a lot of heel of both feet right over left and it's been going on for at least 4 months with history of this in the past   Review of Systems  All other systems reviewed and are negative.      Objective:   Physical Exam  Constitutional: He is oriented to person, place, and time.  Cardiovascular: Intact distal pulses.   Musculoskeletal: Normal range of motion.  Neurological: He is oriented to person, place, and time.  Skin: Skin is warm.  Nursing note and vitals reviewed.  neurovascular status intact muscle strength adequate range of motion within normal limits with patient noted to have exquisite discomfort plantar aspect heel right over left with inflammation fluid around the medial band. Patient's noted to have good digital perfusion is well oriented 3 and has moderate flattening of the arch noted bilateral     Assessment:     Acute plantar fasciitis right over left with inflammation fluid around the medial band    Plan:     H&P x-rays reviewed and condition discussed. At this time I injected the plantar fascia bilateral 3 mg Kenalog 5 mg Xylocaine and applied fascial brace and instructed on physical therapy shoe gear modifications  X-ray report indicated spur formation with no indications of stress fracture or arthritis

## 2016-05-21 ENCOUNTER — Encounter: Payer: Self-pay | Admitting: Podiatry

## 2016-05-21 ENCOUNTER — Ambulatory Visit (INDEPENDENT_AMBULATORY_CARE_PROVIDER_SITE_OTHER): Payer: Medicare Other

## 2016-05-21 ENCOUNTER — Ambulatory Visit: Payer: Medicare Other

## 2016-05-21 ENCOUNTER — Ambulatory Visit (INDEPENDENT_AMBULATORY_CARE_PROVIDER_SITE_OTHER): Payer: Medicare Other | Admitting: Podiatry

## 2016-05-21 DIAGNOSIS — S99921A Unspecified injury of right foot, initial encounter: Secondary | ICD-10-CM | POA: Diagnosis not present

## 2016-05-21 DIAGNOSIS — S99922A Unspecified injury of left foot, initial encounter: Secondary | ICD-10-CM

## 2016-05-21 DIAGNOSIS — M779 Enthesopathy, unspecified: Secondary | ICD-10-CM | POA: Diagnosis not present

## 2016-05-21 DIAGNOSIS — M722 Plantar fascial fibromatosis: Secondary | ICD-10-CM

## 2016-05-21 MED ORDER — TRIAMCINOLONE ACETONIDE 10 MG/ML IJ SUSP
10.0000 mg | Freq: Once | INTRAMUSCULAR | Status: AC
  Administered 2016-05-21: 10 mg

## 2016-05-22 NOTE — Progress Notes (Signed)
Subjective:     Patient ID: Kyle Pham, male   DOB: [birth year], 51 y.o.   MRN: [id number]  HPI patient states that he fell on the left foot and traumatized it and also the right foot has been sore and making it hard to walk on in the joint surface. States that he does not remember specific injury to the right foot   Review of Systems     Objective:   Physical Exam Neurovascular status intact with traumatized left foot inflammatory fasciitis bilateral and inflammation in the sinus tarsi right secondary to probable change in gait. Patient states that the heels are doing somewhat better but still sore    Assessment:     Several conditions with one being sprain left foot one being possibility for ankle arthritis right and one being plantar fascial symptomatology moderately improved    Plan:     H&P and all conditions reviewed and we will start soaks compression therapy and today I injected the right capsule 3 mg Kenalog 5 mg Xylocaine and I gave him advice on physical therapy anti-inflammatories and shoe gear modification  X-rays taken today were negative for signs of fracture left were negative for signs of arthritis of the right ankle joint when evaluated

## 2016-07-27 ENCOUNTER — Telehealth: Payer: Self-pay

## 2016-07-27 NOTE — Telephone Encounter (Signed)
Pt "No Showed" at July CPAP study appointment.  Attempted to contact patient again on 01/06/16 and 01/08/16 with no return call back.

## 2016-07-28 ENCOUNTER — Emergency Department (HOSPITAL_COMMUNITY)
Admission: EM | Admit: 2016-07-28 | Discharge: 2016-07-28 | Disposition: A | Discharge status: Home / Self Care | Payer: Medicare Other | Attending: Emergency Medicine | Admitting: Emergency Medicine

## 2016-07-28 ENCOUNTER — Emergency Department (HOSPITAL_COMMUNITY): Payer: Medicare Other

## 2016-07-28 ENCOUNTER — Encounter (HOSPITAL_COMMUNITY): Payer: Self-pay

## 2016-07-28 DIAGNOSIS — W010XXA Fall on same level from slipping, tripping and stumbling without subsequent striking against object, initial encounter: Secondary | ICD-10-CM | POA: Insufficient documentation

## 2016-07-28 DIAGNOSIS — I10 Essential (primary) hypertension: Secondary | ICD-10-CM | POA: Insufficient documentation

## 2016-07-28 DIAGNOSIS — Y92480 Sidewalk as the place of occurrence of the external cause: Secondary | ICD-10-CM | POA: Insufficient documentation

## 2016-07-28 DIAGNOSIS — Y939 Activity, unspecified: Secondary | ICD-10-CM | POA: Diagnosis not present

## 2016-07-28 DIAGNOSIS — Y999 Unspecified external cause status: Secondary | ICD-10-CM | POA: Diagnosis not present

## 2016-07-28 DIAGNOSIS — S93402A Sprain of unspecified ligament of left ankle, initial encounter: Secondary | ICD-10-CM | POA: Insufficient documentation

## 2016-07-28 DIAGNOSIS — Z87891 Personal history of nicotine dependence: Secondary | ICD-10-CM | POA: Insufficient documentation

## 2016-07-28 DIAGNOSIS — S99912A Unspecified injury of left ankle, initial encounter: Secondary | ICD-10-CM | POA: Diagnosis present

## 2016-07-28 NOTE — ED Notes (Signed)
Got patient ready for discharge 

## 2016-07-28 NOTE — ED Provider Notes (Signed)
[facility] DEPT Provider Note   CSN: [id number] Arrival date & time: 07/28/16  1208  By signing my name below, I, Kyle Pham, attest that this documentation has been prepared under the direction and in the presence of Glendell Docker, NP. Electronically Signed: Evelene Pham, Scribe. 07/28/2016. 1:06 PM.  History   Chief Complaint Chief Complaint  Patient presents with  . Ankle Pain    The history is provided by the patient. No language interpreter was used.    HPI Comments:  Kyle Pham is a 51 y.o. male who presents to the Emergency Department complaining of moderate-severe, constant pain to the left ankle s/p fall today. Pt states he stepped on uneven pavement and fell coming down the steps; states he heard a pop in his left ankle. Pt notes h/o injury to the same ankle but denies h/o surgery to the left ankle. No alleviating factors noted.    Past Medical History:  Diagnosis Date  . Back pain   . GERD (gastroesophageal reflux disease)   . Hyperlipidemia   . Hypertension   . No pertinent past medical history Feb 17, 2012    none at this time  . Pain management     Patient Active Problem List   Diagnosis Date Noted  . Perennial allergic rhinitis 11/01/2015  . Coughing 11/01/2015  . Chronic sinusitis 11/01/2015  . Cervicalgia 05/17/2012  . Chronic myofascial pain 05/17/2012  . Chronic low back pain 05/17/2012    Past Surgical History:  Procedure Laterality Date  . TONSILLECTOMY  51 years old  . WISDOM TOOTH EXTRACTION  51 years old       Home Medications    Prior to Admission medications   Medication Sig Start Date End Date Taking? Authorizing Provider  cetirizine (ZYRTEC) 10 MG tablet Take 10 mg by mouth daily.    Historical Provider, MD  dorzolamide-timolol (COSOPT) 22.3-6.8 MG/ML ophthalmic solution Place 1 drop into both eyes 2 (two) times daily.    Historical Provider, MD  gemfibrozil (LOPID) 600 MG tablet Take 600 mg by mouth 2 (two) times  daily before a meal.    Historical Provider, MD  latanoprost (XALATAN) 0.005 % ophthalmic solution Place 1 drop into both eyes at bedtime.    Historical Provider, MD  montelukast (SINGULAIR) 10 MG tablet Take 10 mg by mouth at bedtime.    Historical Provider, MD  omeprazole (PRILOSEC) 20 MG capsule Take 1 capsule (20 mg total) by mouth daily. 08/24/12   Kyle Croft, MD    Family History Family History  Problem Relation Age of Onset  . Diabetes Mother   . Hypertension Mother   . Glaucoma Mother   . Kidney disease Mother   . Liver cancer Brother   . Allergic rhinitis Neg Hx   . Angioedema Neg Hx   . Asthma Neg Hx   . Atopy Neg Hx   . Eczema Neg Hx   . Immunodeficiency Neg Hx   . Urticaria Neg Hx     Social History Social History  Substance Use Topics  . Smoking status: Former Smoker    Packs/day: 0.50    Years: 32.00    Types: Cigarettes    Quit date: 07/18/2011  . Smokeless tobacco: Never Used  . Alcohol use 0.0 oz/week     Allergies   Patient has no known allergies.   Review of Systems Review of Systems  Constitutional: Negative for chills and fever.  Respiratory: Negative for shortness of breath.   Cardiovascular: Negative for  chest pain.  Musculoskeletal: Positive for arthralgias and myalgias.  Neurological: Negative for syncope and weakness.     Physical Exam Updated Vital Signs BP (!) 151/98 (BP Location: Right Arm)   Pulse 60   Temp 97.8 F (36.6 C) (Oral)   Resp 16   Ht 5\' 11"  (1.803 m)   Wt 260 lb (117.9 kg)   SpO2 97%   BMI 36.26 kg/m   Physical Exam  Constitutional: He is oriented to person, place, and time. He appears well-developed and well-nourished. No distress.  HENT:  Head: Normocephalic and atraumatic.  Eyes: Conjunctivae are normal.  Cardiovascular: Normal rate.   Pulmonary/Chest: Effort normal.  Abdominal: He exhibits no distension.  Musculoskeletal:  Lateral left ankle swelling noted. Pulses intact.  Neurological: He is alert  and oriented to person, place, and time.  Skin: Skin is warm and dry.  Psychiatric: He has a normal mood and affect.  Nursing note and vitals reviewed.    ED Treatments / Results  DIAGNOSTIC STUDIES:  Oxygen Saturation is 97% on RA, normal by my interpretation.    COORDINATION OF CARE:  1:02 PM Discussed treatment plan with pt at bedside and pt agreed to plan.  Labs (all labs ordered are listed, but only abnormal results are displayed) Labs Reviewed - No data to display  EKG  EKG Interpretation None       Radiology Dg Foot Complete Left  Result Date: 07/28/2016 CLINICAL DATA:  Status post fall down stairs this morning with with persistent pain over the lateral foot and ankle. EXAM: LEFT FOOT - COMPLETE 3+ VIEW COMPARISON:  Left ankle series of today's date and a left foot series of May 21, 2016. FINDINGS: The bones of the foot are subjectively adequately mineralized. There is no acute fracture or dislocation. The joint spaces are well-maintained. There are plantar and Achilles region calcaneal spurs. There is well corticated bony density that projects over the dorsum of the distal aspect of the talus. IMPRESSION: No definite acute bony abnormality of the left foot. Previous avulsion fracture from the dorsum of the distal aspect of the talus appears stable. Electronically Signed   By: David  Martinique M.D.   On: 07/28/2016 12:48    Procedures Procedures (including critical care time)  Medications Ordered in ED Medications - No data to display   Initial Impression / Assessment and Plan / ED Course  I have reviewed the triage vital signs and the nursing notes.  Pertinent labs & imaging results that were available during my care of the patient were reviewed by me and considered in my medical decision making (see chart for details).  Clinical Course as of Jul 29 1434  Tue Jul 28, 2016  1317 DG Ankle Complete Left [VP]    Clinical Course User Index [VP] Glendell Docker,  NP    No acute bony abnormality.pt placed in aso for comfort  Final Clinical Impressions(s) / ED Diagnoses   Final diagnoses:  None    New Prescriptions New Prescriptions   No medications on file   I personally performed the services described in this documentation, which was scribed in my presence. The recorded information has been reviewed and is accurate.     Glendell Docker, NP 07/28/16 [place], MD 07/28/16 2149

## 2016-07-28 NOTE — ED Notes (Signed)
Pt voices understanding of discharge instructions. NAD. Ambulatory with crutches at departure, however wheeled to waiting room.

## 2016-07-28 NOTE — ED Triage Notes (Signed)
Patient reports that he slipped and fell twisting left ankle and pain to lower back. alert and oriented, no loc. NAD

## 2016-10-14 ENCOUNTER — Ambulatory Visit (INDEPENDENT_AMBULATORY_CARE_PROVIDER_SITE_OTHER): Payer: Medicare Other | Admitting: Neurology

## 2016-10-14 DIAGNOSIS — G4733 Obstructive sleep apnea (adult) (pediatric): Secondary | ICD-10-CM

## 2016-10-23 NOTE — Progress Notes (Signed)
Please call and inform patient that I have entered an order for treatment with positive airway pressure (PAP) treatment of obstructive sleep apnea (OSA). He did well during the latest sleep study with CPAP. We will, therefore, arrange for a machine for home use through a DME (durable medical equipment) company of His choice; and I will see the patient back in follow-up in about 10 weeks, can also see MM or CM. Please also explain to the patient that I will be looking out for compliance data, which can be downloaded from the machine (stored on an SD card, that is inserted in the machine) or via remote access through a modem, that is built into the machine. At the time of the followup appointment we will discuss sleep study results and how it is going with PAP treatment at home. Please advise patient to bring His machine at the time of the first FU visit, even though this is cumbersome. Bringing the machine for every visit after that will likely not be needed, but often helps for the first visit to troubleshoot if needed. Please re-enforce the importance of compliance with treatment and the need for [place] to monitor compliance data - often an insurance requirement and actually good feedback for the patient as far as how they are doing.  Also remind patient, that any interim PAP machine or mask issues should be first addressed with the DME company, as they can often help better with technical and mask fit issues. Please ask if patient has a preference regarding DME company.  Please also make sure, the patient has a follow-up appointment with me in about 10 weeks from the setup date, thanks.  Once you have spoken to the patient - and faxed/routed report to PCP and referring MD (if other than PCP), you can close this encounter, thanks,   Star Age, MD, PhD [facility] ([place])

## 2016-10-23 NOTE — Procedures (Signed)
PATIENT'S NAME:  Kyle Pham, Kyle Pham DOB:      1965/06/19      MR#:    [id number]     DATE OF RECORDING: 10/14/2016 REFERRING M.D.:  Joneen Caraway, MD Study Performed:   CPAP  Titration HISTORY: 51 year old man with a history of allergic rhinitis, dyslipidemia, glaucoma, obesity, and chronic back pain, who returns for CPAP titration. His baseline sleep study from 11/10/16 showed an AHI of 21.4, REM AHI of 30.9/hour, Supine AHI of 37.3/hour, and O2 nadir of 86%. The patient endorsed the Epworth Sleepiness Scale at 11 points. The patient's weight is 266 pounds with a height of 70 (inches), resulting in a BMI of 38.2 kg/m2. The patient's neck circumference measured 19.8 inches.  CURRENT MEDICATIONS: Zyrtec, Cosopt, Lopid, Xalatan, Singulair, Prilosec  PROCEDURE:  This is a multichannel digital polysomnogram utilizing the SomnoStar 11.2 system.  Electrodes and sensors were applied and monitored per AASM Specifications.   EEG, EOG, Chin and Limb EMG, were sampled at 200 Hz.  ECG, Snore and Nasal Pressure, Thermal Airflow, Respiratory Effort, CPAP Flow and Pressure, Oximetry was sampled at 50 Hz. Digital video and audio were recorded.      The patient was fitted with a medium F20 FFM (preferred over nasal mask and nasal pillows). CPAP was initiated at 7 cmH20 with heated humidity per AASM standards and pressure was advanced to 10 cmH20 because of hypopneas, apneas and desaturations. At a PAP pressure of 10 cmH20, there was a reduction of the AHI to 0 with supine REM sleep achieved and O2 nadir of 89%.   Lights Out was at 22:50 and Lights On at 05:02. Total recording time (TRT) was 372 minutes, with a total sleep time (TST) of 360 minutes. The patient's sleep latency was 12 minutes with 0 minutes of wake time after sleep onset. REM latency was 57 minutes.  The sleep efficiency was 96.8 %.    SLEEP ARCHITECTURE: WASO (Wake after sleep onset) was 0 minutes.  There were 1.5 minutes in Stage N1, 131.5 minutes  Stage N2, 98 minutes Stage N3 and 129 minutes in Stage REM.  The percentage of Stage N1 was .4%, Stage N2 was 36.5%, Stage N3 was 27.2%, which is increased, and Stage R (REM sleep) was 35.8%, which is increased and in keeping with REM rebound.   The arousals were noted as: 34 were spontaneous, 0 were associated with PLMs, 6 were associated with respiratory events.  Audio and video analysis did not show any abnormal or unusual movements, behaviors, phonations or vocalizations.  The patient took no bathroom breaks. The EKG was in keeping with normal sinus rhythm (NSR).  RESPIRATORY ANALYSIS:  There was a total of 7 respiratory events: 0 obstructive apneas, 3 central apneas and 0 mixed apneas with a total of 3 apneas and an apnea index (AI) of 0.5 /hour. There were 4 hypopneas with a hypopnea index of .7/hour. The patient also had 0 respiratory event related arousals (RERAs).      The total APNEA/HYPOPNEA INDEX  (AHI) was 1.2 /hour and the total RESPIRATORY DISTURBANCE INDEX was 1.2 .hour  4 events occurred in REM sleep and 3 events in NREM. The REM AHI was 1.9 /hour versus a non-REM AHI of .8 /hour.  The patient spent 182 minutes of total sleep time in the supine position and 178 minutes in non-supine. The supine AHI was 0.0, versus a non-supine AHI of 2.3.  OXYGEN SATURATION & C02:  The baseline 02 saturation was 94%, with the lowest  being 86%. Time spent below 89% saturation equaled 1 minutes.  PERIODIC LIMB MOVEMENTS: The patient had a total of 0 Periodic Limb Movements. The Periodic Limb Movement (PLM) index was 0 and the PLM Arousal index was 0 /hour.  Post-study, the patient indicated that sleep was better than usual.   DIAGNOSIS  1.  Obstructive Sleep Apnea   PLANS/RECOMMENDATIONS:  1. This study demonstrates resolution of the patient's obstructive sleep apnea with CPAP therapy. I will, therefore, start the patient on home CPAP treatment at a pressure of 10 cm via medium FFM with heated  humidity. The patient should be reminded to be fully compliant with PAP therapy to improve sleep related symptoms and decrease long term cardiovascular risks. The patient should be reminded, that it may take up to 3 months to get fully used to using PAP with all planned sleep. The earlier full compliance is achieved, the better long term compliance tends to be. Please note that untreated obstructive sleep apnea carries additional perioperative morbidity. Patients with significant obstructive sleep apnea should receive perioperative PAP therapy and the surgeons and particularly the anesthesiologist should be informed of the diagnosis and the severity of the sleep disordered breathing. 2. The patient should be cautioned not to drive, work at heights, or operate dangerous or heavy equipment when tired or sleepy. Review and reiteration of good sleep hygiene measures should be pursued with any patient. 3. The patient will be seen in follow-up by Dr. Rexene Alberts at [facility] for discussion of the test results and further management strategies. The referring provider will be notified of the test results.  I certify that I have reviewed the entire raw data recording prior to the issuance of this report in accordance with the Standards of Accreditation of the American Academy of Sleep Medicine (AASM)   Star Age, MD, PhD Diplomat, American Board of Psychiatry and Neurology (Neurology and Sleep Medicine)

## 2016-10-23 NOTE — Addendum Note (Signed)
Addended by: Star Age on: 10/23/2016 09:33 AM   Modules accepted: Orders

## 2016-10-26 ENCOUNTER — Telehealth: Payer: Self-pay

## 2016-10-26 NOTE — Telephone Encounter (Signed)
I called pt. I advised pt that Dr. Rexene Alberts reviewed their sleep study results and found that pt did well during the latest sleep study with the cpap. Dr. Rexene Alberts recommends that pt start a cpap at home. I reviewed PAP compliance expectations with the pt. Pt is agreeable to starting a CPAP. I advised pt that an order will be sent to a DME, [facility], and [facility] will call the pt within about one week after they file with the pt's insurance. [facility] will show the pt how to use the machine, fit for masks, and troubleshoot the CPAP if needed. A follow up appt was made for insurance purposes with Dr. Rexene Alberts on Thursday, September 6th, 2018 at 8:30am. Pt has an appt on 11/12/2016 already, and this appt will need to be cancelled. Pt verbalized understanding to arrive 15 minutes early and bring their CPAP. A letter with all of this information in it will be mailed to the pt as a reminder. I verified with the pt that the address we have on file is correct. Pt verbalized understanding of results. Pt had no questions at this time but was encouraged to call back if questions arise.

## 2016-10-26 NOTE — Telephone Encounter (Signed)
-----   Message from Star Age, MD sent at 10/23/2016  9:33 AM EDT ----- Please call and inform patient that I have entered an order for treatment with positive airway pressure (PAP) treatment of obstructive sleep apnea (OSA). He did well during the latest sleep study with CPAP. We will, therefore, arrange for a machine for home use through a DME (durable medical equipment) company of His choice; and I will see the patient back in follow-up in about 10 weeks, can also see MM or CM. Please also explain to the patient that I will be looking out for compliance data, which can be downloaded from the machine (stored on an SD card, that is inserted in the machine) or via remote access through a modem, that is built into the machine. At the time of the followup appointment we will discuss sleep study results and how it is going with PAP treatment at home. Please advise patient to bring His machine at the time of the first FU visit, even though this is cumbersome. Bringing the machine for every visit after that will likely not be needed, but often helps for the first visit to troubleshoot if needed. Please re-enforce the importance of compliance with treatment and the need for [place] to monitor compliance data - often an insurance requirement and actually good feedback for the patient as far as how they are doing.  Also remind patient, that any interim PAP machine or mask issues should be first addressed with the DME company, as they can often help better with technical and mask fit issues. Please ask if patient has a preference regarding DME company.  Please also make sure, the patient has a follow-up appointment with me in about 10 weeks from the setup date, thanks.  Once you have spoken to the patient - and faxed/routed report to PCP and referring MD (if other than PCP), you can close this encounter, thanks,   Star Age, MD, PhD [facility] ([place])

## 2016-11-06 ENCOUNTER — Other Ambulatory Visit: Payer: Self-pay | Admitting: Allergy and Immunology

## 2016-11-12 ENCOUNTER — Ambulatory Visit: Payer: Medicare Other | Admitting: Neurology

## 2016-12-04 ENCOUNTER — Other Ambulatory Visit: Payer: Self-pay | Admitting: Allergy and Immunology

## 2016-12-24 ENCOUNTER — Ambulatory Visit: Payer: Self-pay | Admitting: Neurology

## 2016-12-30 ENCOUNTER — Other Ambulatory Visit: Payer: Self-pay | Admitting: Allergy and Immunology
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
## 2017-06-21 NOTE — Telephone Encounter (Signed)
Received notice from [employer] that they need notes for his cpap compliance in order to keep his cpap, per medicare guidelines.  I called pt and explained this, he is agreeable to an appt on 06/23/17 at 3:45pm with Hoyle Sauer, NP. Pt verbalized understanding of new appt date and time.

## 2017-06-22 ENCOUNTER — Encounter: Payer: Self-pay | Admitting: Nurse Practitioner

## 2017-06-22 NOTE — Progress Notes (Signed)
[facility]  PATIENT: Kyle Pham DOB: 29-Jan-1966   REASON FOR VISIT: Obstructive sleep apnea here for CPAP compliance HISTORY FROM:patient    HISTORY OF PRESENT ILLNESS: 11/05/15 Kyle Pham is a 52 year old right-handed gentleman with an underlying medical history of allergic rhinitis, dyslipidemia, glaucoma, obesity, and  back pain,  who reports snoring and excessive daytime somnolence. I reviewed your office note from 10/02/2015. He no longer sees pain management, has been off of oxycodone and off of gabapentin. He has applied for a job driving a [employer] bus. He has previously worked as a Secondary school teacher. He has a remote Hx of 3rd shift work in 2011 and 2012.   His bedtime varies, usually he tries to be in bed between 11 PM and midnight. He does not have any trouble falling asleep, wake time is around 6 AM, he does not typically wake up rested. Epworth sleepiness score is 11 out of 24 today, fatigue score is 20 out of 63. He denies any restless leg symptoms. He has numbness occasionally in his left leg in the thigh, laterally, sometimes moves excessively and sleep and has kicked his leg on the left while asleep per wife's report. He is not aware of any obstructive sleep apnea and his family. He has a history of low back pain. He has 3 grown children, ages 53, 39 and 74. He lives at home with his wife and 2 younger children. He quit smoking about 5 years ago, drinks beer occasionally and soda occasionally maybe up to 3 times a week. He has recently started seeing an allergy specialist and has been started on allergy medication and nasal spray. He denies morning headaches or nocturia on a night to night basis UPDATE3/6/19 CM  Mr. Olenik, 52 year old male with history of obstructive sleep apnea on CPAP.  He also has a history of dyslipidemia glaucoma and morbid obesity.  He has a history of back pain and occasionally has numbness in his left leg in the thigh area  laterally.  He denies any morning headaches he has no nocturia and he is compliant with his CPAP.  Compliance data dated 05/24/2017-06/22/2017 shows compliance greater than 4 hours at 93%.  He did not use his CPAP for 2 days due to respiratory illness.  Average usage 5 hours 44 minutes. 10 cm of pressure.  EPR level 3.  AHI 1.9.  ESS 8 fatigue severity scale 19 he returns for reevaluation.  He needs CPAP supplies REVIEW OF SYSTEMS: Full 14 system review of systems performed and notable only for those listed, all others are neg:  Constitutional: neg  Cardiovascular: neg Ear/Nose/Throat: neg  Skin: neg Eyes: Allergies, redness Respiratory: neg Gastroitestinal: neg  Hematology/Lymphatic: neg  Endocrine: neg Musculoskeletal: Back pain Allergy/Immunology: neg Neurological: neg Psychiatric: neg Sleep : Obstructive sleep apnea with CPAP   ALLERGIES: No Known Allergies  HOME MEDICATIONS: Outpatient Medications Prior to Visit  Medication Sig Dispense Refill  . cetirizine (ZYRTEC) 10 MG tablet Take 10 mg by mouth daily.    . dorzolamide-timolol (COSOPT) 22.3-6.8 MG/ML ophthalmic solution Place 1 drop into both eyes 2 (two) times daily.    Marland Kitchen gemfibrozil (LOPID) 600 MG tablet Take 600 mg by mouth 2 (two) times daily before a meal.    . latanoprost (XALATAN) 0.005 % ophthalmic solution Place 1 drop into both eyes at bedtime.    . montelukast (SINGULAIR) 10 MG tablet Take 10 mg by mouth at bedtime.    . Omega-3 Fatty Acids (FISH OIL)  1000 MG CAPS Take by mouth. Takes 2 daily    . omeprazole (PRILOSEC) 20 MG capsule Take 1 capsule (20 mg total) by mouth daily. 60 capsule 0   No facility-administered medications prior to visit.     PAST MEDICAL HISTORY: Past Medical History:  Diagnosis Date  . Back pain   . GERD (gastroesophageal reflux disease)   . Hyperlipidemia   . Hypertension   . No pertinent past medical history Feb 17, 2012    none at this time  . Pain management     PAST SURGICAL  HISTORY: Past Surgical History:  Procedure Laterality Date  . TONSILLECTOMY  52 years old  . WISDOM TOOTH EXTRACTION  53 years old    FAMILY HISTORY: Family History  Problem Relation Age of Onset  . Diabetes Mother   . Hypertension Mother   . Glaucoma Mother   . Kidney disease Mother   . Liver cancer Brother   . Allergic rhinitis Neg Hx   . Angioedema Neg Hx   . Asthma Neg Hx   . Atopy Neg Hx   . Eczema Neg Hx   . Immunodeficiency Neg Hx   . Urticaria Neg Hx     SOCIAL HISTORY: Social History   Socioeconomic History  . Marital status: Married    Spouse name: Not on file  . Number of children: 3  . Years of education: 4  . Highest education level: Not on file  Social Needs  . Financial resource strain: Not on file  . Food insecurity - worry: Not on file  . Food insecurity - inability: Not on file  . Transportation needs - medical: Not on file  . Transportation needs - non-medical: Not on file  Occupational History  . Occupation: N/A  Tobacco Use  . Smoking status: Former Smoker    Packs/day: 0.50    Years: 32.00    Pack years: 16.00    Types: Cigarettes    Last attempt to quit: 07/18/2011    Years since quitting: 5.9  . Smokeless tobacco: Never Used  Substance and Sexual Activity  . Alcohol use: Yes    Alcohol/week: 0.0 oz  . Drug use: No  . Sexual activity: Not on file  Other Topics Concern  . Not on file  Social History Narrative   Caffeine: occasional soda      PHYSICAL EXAM  Vitals:   06/23/17 1543  BP: (!) 141/79  Pulse: 68  Weight: 268 lb 6.4 oz (121.7 kg)  Height: 5\' 10"  (1.778 m)   Body mass index is 38.51 kg/m.  Generalized: Well developed, morbidly obese male in no acute distress  Head: normocephalic and atraumatic,. Oropharynx benign  Neck: Supple,  Musculoskeletal: No deformity   Neurological examination   Mentation: Alert oriented to time, place, history taking. Attention span and concentration appropriate. Recent and remote  memory intact.  Follows all commands speech and language fluent.   Cranial nerve II-XII: .Pupils were equal round reactive to light extraocular movements were full, visual field were full on confrontational test. Facial sensation and strength were normal. hearing was intact to finger rubbing bilaterally. Uvula tongue midline. head turning and shoulder shrug were normal and symmetric.Tongue protrusion into cheek strength was normal. Motor: normal bulk and tone, full strength in the BUE, BLE, fine finger movements normal, no pronator drift. No focal weakness Sensory: normal and symmetric to light touch,  Coordination: finger-nose-finger, heel-to-shin bilaterally, no dysmetria Reflexes: Symmetric upper and lower plantar responses were flexor bilaterally.  Gait and Station: Rising up from seated position without assistance, normal stance,  moderate stride, good arm swing, smooth turning, able to perform tiptoe, and heel walking without difficulty. Tandem gait is steady.  DIAGNOSTIC DATA (LABS, IMAGING, TESTING) - I reviewed patient records, labs, notes, testing and imaging myself where available.  Lab Results  Component Value Date   WBC 12.6 (H) 10/11/2014   HGB 12.6 (L) 10/11/2014   HCT 40.0 10/11/2014   MCV 83.9 10/11/2014   PLT 308 10/11/2014      Component Value Date/Time   NA 141 10/11/2014 0137   K 3.4 (L) 10/11/2014 0137   CL 107 10/11/2014 0137   CO2 26 10/11/2014 0137   GLUCOSE 123 (H) 10/11/2014 0137   BUN 9 10/11/2014 0137   CREATININE 1.03 10/11/2014 0137   CALCIUM 9.2 10/11/2014 0137   GFRNONAA >60 10/11/2014 0137   GFRAA >60 10/11/2014 0137    ASSESSMENT AND PLAN  52 y.o. year old male  has a past medical history of Back pain,  Hyperlipidemia, Hypertension, and obstructive sleep apnea here for CPAP compliance.Data dated 05/24/2017-06/22/2017 shows compliance greater than 4 hours at 93%.  He did not use his CPAP for 2 days due to respiratory illness.  Average usage 5 hours 44  minutes. 10 cm of pressure.  EPR level 3.  AHI 1.9.  ESS 8 fatigue severity scale 19 The patient is a current patient of Dr. Rexene Alberts  who is out of the office today . This note is sent to the work in doctor.     CPAP compliance 93% Continue same settings  We will order supplies follow-up yearly and as needed Dennie Bible, [facility], [facility], APRN  [facility] [address] [place], [place] [address] [phone]

## 2017-06-23 ENCOUNTER — Ambulatory Visit (INDEPENDENT_AMBULATORY_CARE_PROVIDER_SITE_OTHER): Payer: Medicare Other | Admitting: Nurse Practitioner

## 2017-06-23 ENCOUNTER — Encounter: Payer: Self-pay | Admitting: Nurse Practitioner

## 2017-06-23 DIAGNOSIS — G4733 Obstructive sleep apnea (adult) (pediatric): Secondary | ICD-10-CM | POA: Diagnosis not present

## 2017-06-23 DIAGNOSIS — Z9989 Dependence on other enabling machines and devices: Secondary | ICD-10-CM

## 2017-06-23 NOTE — Patient Instructions (Signed)
CPAP compliance 93% Continue same settings  follow-up yearly and as needed

## 2017-06-25 NOTE — Progress Notes (Signed)
I have reviewed and agreed above plan. 

## 2017-09-08 ENCOUNTER — Encounter: Payer: Self-pay | Admitting: Gastroenterology

## 2017-10-06 ENCOUNTER — Encounter: Payer: Medicare Other | Admitting: Gastroenterology

## 2017-11-11 ENCOUNTER — Encounter: Payer: Medicare Other | Admitting: Gastroenterology

## 2017-12-02 ENCOUNTER — Encounter

## 2017-12-02 ENCOUNTER — Ambulatory Visit (AMBULATORY_SURGERY_CENTER): Payer: Medicare Other

## 2017-12-02 VITALS — Ht 71.0 in | Wt 269.0 lb

## 2017-12-02 DIAGNOSIS — Z1211 Encounter for screening for malignant neoplasm of colon: Secondary | ICD-10-CM

## 2017-12-02 MED ORDER — NA SULFATE-K SULFATE-MG SULF 17.5-3.13-1.6 GM/177ML PO SOLN: 1.0000 | Freq: Once | ORAL | 0 refills | Status: AC

## 2017-12-02 NOTE — Progress Notes (Signed)
Denies allergies to eggs or soy products. Denies complication of anesthesia or sedation. Denies use of weight loss medication. Denies use of O2.   Emmi instructions declined.  

## 2017-12-15 ENCOUNTER — Telehealth: Payer: Self-pay | Admitting: Gastroenterology

## 2017-12-15 NOTE — Telephone Encounter (Signed)
Patient stated he started his preparation this evening, however earlier in day he had eaten a solid meal for lunch and a small solid meal for breakfast.  He had already started to have BMs, but read instructions and saw he was supposed to be on clears and called for directions.  I stated that our goal is as clean a colonoscopy to decrease complications and to decrease need for repeat colonoscopies and to provide adequate colon cancer screening.  He is going to complete his preparation otherwise as noted in his paperwork.  I said, there is chance that if he is not clear in the AM before he leaves that he may not be able to have procedure completed.  With that being said, he does want to try.  I told him to drink lots of water/liquid in addition to the laxative prescribed and also to be up and moving as much as possible to increase motility in a sense.  He agrees and also understands that if he is not clear he will need to be reschedule or if preparation not great may have an earlier interval colonoscopy performed.  Patient appreciative for call.  Justice Britain, MD [facility] Office # [phone]

## 2017-12-16 ENCOUNTER — Ambulatory Visit (AMBULATORY_SURGERY_CENTER): Payer: Medicare Other | Admitting: Gastroenterology

## 2017-12-16 ENCOUNTER — Encounter: Payer: Self-pay | Admitting: Gastroenterology

## 2017-12-16 VITALS — BP 120/59 | HR 52 | Temp 99.5°F | Resp 17 | Ht 70.0 in | Wt 268.0 lb

## 2017-12-16 DIAGNOSIS — D123 Benign neoplasm of transverse colon: Secondary | ICD-10-CM

## 2017-12-16 DIAGNOSIS — D128 Benign neoplasm of rectum: Secondary | ICD-10-CM

## 2017-12-16 DIAGNOSIS — Z1211 Encounter for screening for malignant neoplasm of colon: Secondary | ICD-10-CM | POA: Diagnosis not present

## 2017-12-16 DIAGNOSIS — K621 Rectal polyp: Secondary | ICD-10-CM | POA: Diagnosis not present

## 2017-12-16 DIAGNOSIS — D122 Benign neoplasm of ascending colon: Secondary | ICD-10-CM | POA: Diagnosis not present

## 2017-12-16 DIAGNOSIS — D124 Benign neoplasm of descending colon: Secondary | ICD-10-CM | POA: Diagnosis not present

## 2017-12-16 NOTE — Progress Notes (Signed)
Report to PACU, RN, vss, BBS= Clear.  

## 2017-12-16 NOTE — Op Note (Signed)
[facility] Patient Name: Kyle Pham Procedure Date: 12/16/2017 8:52 AM MRN: [id number] Endoscopist: Mauri Pole , MD Age: 52 Referring MD:  Date of Birth: [birth year] Gender: Male Account #: [id number] Procedure:                Colonoscopy Indications:              Screening for colorectal malignant neoplasm Medicines:                Monitored Anesthesia Care Procedure:                Pre-Anesthesia Assessment:                           - Prior to the procedure, a History and Physical                            was performed, and patient medications and                            allergies were reviewed. The patient's tolerance of                            previous anesthesia was also reviewed. The risks                            and benefits of the procedure and the sedation                            options and risks were discussed with the patient.                            All questions were answered, and informed consent                            was obtained. Prior Anticoagulants: The patient has                            taken no previous anticoagulant or antiplatelet                            agents. ASA Grade Assessment: III - A patient with                            severe systemic disease. After reviewing the risks                            and benefits, the patient was deemed in                            satisfactory condition to undergo the procedure.                           After obtaining informed consent, the colonoscope  was passed under direct vision. Throughout the                            procedure, the patient's blood pressure, pulse, and                            oxygen saturations were monitored continuously. The                            Colonoscope was introduced through the anus and                            advanced to the the cecum, identified by                            appendiceal  orifice and ileocecal valve. The                            colonoscopy was performed without difficulty. The                            patient tolerated the procedure well. The quality                            of the bowel preparation was excellent. The                            ileocecal valve, appendiceal orifice, and rectum                            were photographed. Scope In: 9:07:53 AM Scope Out: 9:28:26 AM Scope Withdrawal Time: 0 hours 19 minutes 18 seconds  Total Procedure Duration: 0 hours 20 minutes 33 seconds  Findings:                 The perianal and digital rectal examinations were                            normal.                           Five pedunculated and semi-pedunculated polyps were                            found in the descending colon, transverse colon,                            hepatic flexure and ascending colon. The polyps                            were 12 to 22 mm in size. These polyps were removed                            with a hot snare. Resection and retrieval were  complete.                           A 2 mm polyp was found in the descending colon. The                            polyp was sessile. The polyp was removed with a                            cold biopsy forceps. Resection and retrieval were                            complete.                           Two sessile polyps were found in the rectum and                            transverse colon. The polyps were 4 to 8 mm in                            size. These polyps were removed with a cold snare.                            Resection and retrieval were complete.                           A few small-mouthed diverticula were found in the                            sigmoid colon and descending colon.                           Non-bleeding internal hemorrhoids were found during                            retroflexion. The hemorrhoids were  small. Complications:            No immediate complications. Estimated Blood Loss:     Estimated blood loss was minimal. Impression:               - Five 12 to 22 mm polyps in the descending colon,                            in the transverse colon, at the hepatic flexure and                            in the ascending colon, removed with a hot snare.                            Resected and retrieved.                           - One 2 mm polyp in the descending colon, removed  with a cold biopsy forceps. Resected and retrieved.                           - Two 4 to 8 mm polyps in the rectum and in the                            transverse colon, removed with a cold snare.                            Resected and retrieved.                           - Diverticulosis in the sigmoid colon and in the                            descending colon.                           - Non-bleeding internal hemorrhoids. Recommendation:           - Patient has a contact number available for                            emergencies. The signs and symptoms of potential                            delayed complications were discussed with the                            patient. Return to normal activities tomorrow.                            Written discharge instructions were provided to the                            patient.                           - Resume previous diet.                           - Continue present medications.                           - Await pathology results.                           - Repeat colonoscopy in 1 year for surveillance                            based on pathology results. Mauri Pole, MD 12/16/2017 9:37:35 AM This report has been signed electronically.

## 2017-12-16 NOTE — Patient Instructions (Signed)
Discharge instructions given. Handouts on polyps,diverticulosis and hemorrhoids. Resume previous medications. YOU HAD AN ENDOSCOPIC PROCEDURE TODAY AT THE [facility]:   Refer to the procedure report that was given to you for any specific questions about what was found during the examination.  If the procedure report does not answer your questions, please call your gastroenterologist to clarify.  If you requested that your care partner not be given the details of your procedure findings, then the procedure report has been included in a sealed envelope for you to review at your convenience later.  YOU SHOULD EXPECT: Some feelings of bloating in the abdomen. Passage of more gas than usual.  Walking can help get rid of the air that was put into your GI tract during the procedure and reduce the bloating. If you had a lower endoscopy (such as a colonoscopy or flexible sigmoidoscopy) you may notice spotting of blood in your stool or on the toilet paper. If you underwent a bowel prep for your procedure, you may not have a normal bowel movement for a few days.  Please Note:  You might notice some irritation and congestion in your nose or some drainage.  This is from the oxygen used during your procedure.  There is no need for concern and it should clear up in a day or so.  SYMPTOMS TO REPORT IMMEDIATELY:   Following lower endoscopy (colonoscopy or flexible sigmoidoscopy):  Excessive amounts of blood in the stool  Significant tenderness or worsening of abdominal pains  Swelling of the abdomen that is new, acute  Fever of 100F or higher   For urgent or emergent issues, a gastroenterologist can be reached at any hour by calling [phone].   DIET:  We do recommend a small meal at first, but then you may proceed to your regular diet.  Drink plenty of fluids but you should avoid alcoholic beverages for 24 hours.  ACTIVITY:  You should plan to take it easy for the rest of today and you  should NOT DRIVE or use heavy machinery until tomorrow (because of the sedation medicines used during the test).    FOLLOW UP: Our staff will call the number listed on your records the next business day following your procedure to check on you and address any questions or concerns that you may have regarding the information given to you following your procedure. If we do not reach you, we will leave a message.  However, if you are feeling well and you are not experiencing any problems, there is no need to return our call.  We will assume that you have returned to your regular daily activities without incident.  If any biopsies were taken you will be contacted by phone or by letter within the next 1-3 weeks.  Please call us at [phone] if you have not heard about the biopsies in 3 weeks.    SIGNATURES/CONFIDENTIALITY: You and/or your care partner have signed paperwork which will be entered into your electronic medical record.  These signatures attest to the fact that that the information above on your After Visit Summary has been reviewed and is understood.  Full responsibility of the confidentiality of this discharge information lies with you and/or your care-partner. 

## 2017-12-16 NOTE — Progress Notes (Signed)
Called to room to assist during endoscopic procedure.  Patient ID and intended procedure confirmed with present staff. Received instructions for my participation in the procedure from the performing physician.  

## 2017-12-21 ENCOUNTER — Telehealth: Payer: Self-pay

## 2017-12-21 ENCOUNTER — Encounter: Payer: Self-pay | Admitting: Nurse Practitioner

## 2017-12-21 NOTE — Telephone Encounter (Signed)
  Follow up Call-  Call back number 12/16/2017  Post procedure Call Back phone  # [phone]  Permission to leave phone message Yes  Some recent data might be hidden     Patient questions:  Do you have a fever, pain , or abdominal swelling? No. Pain Score  0 *  Have you tolerated food without any problems? Yes.    Have you been able to return to your normal activities? Yes.    Do you have any questions about your discharge instructions: Diet   No. Medications  No. Follow up visit  No.  Do you have questions or concerns about your Care? No.  Actions: * If pain score is 4 or above: No action needed, pain <4.

## 2017-12-23 ENCOUNTER — Encounter: Payer: Self-pay | Admitting: Gastroenterology

## 2017-12-24 ENCOUNTER — Emergency Department (HOSPITAL_COMMUNITY)
Admission: EM | Admit: 2017-12-24 | Discharge: 2017-12-24 | Disposition: A | Discharge status: Home / Self Care | Payer: Medicare Other | Attending: Emergency Medicine | Admitting: Emergency Medicine

## 2017-12-24 ENCOUNTER — Other Ambulatory Visit: Payer: Self-pay

## 2017-12-24 ENCOUNTER — Encounter (HOSPITAL_COMMUNITY): Payer: Self-pay | Admitting: Emergency Medicine

## 2017-12-24 DIAGNOSIS — K219 Gastro-esophageal reflux disease without esophagitis: Secondary | ICD-10-CM | POA: Diagnosis not present

## 2017-12-24 DIAGNOSIS — Z87891 Personal history of nicotine dependence: Secondary | ICD-10-CM | POA: Insufficient documentation

## 2017-12-24 DIAGNOSIS — R110 Nausea: Secondary | ICD-10-CM | POA: Insufficient documentation

## 2017-12-24 DIAGNOSIS — R0602 Shortness of breath: Secondary | ICD-10-CM | POA: Diagnosis not present

## 2017-12-24 DIAGNOSIS — R0789 Other chest pain: Secondary | ICD-10-CM | POA: Diagnosis not present

## 2017-12-24 DIAGNOSIS — I10 Essential (primary) hypertension: Secondary | ICD-10-CM | POA: Insufficient documentation

## 2017-12-24 DIAGNOSIS — Z79899 Other long term (current) drug therapy: Secondary | ICD-10-CM | POA: Insufficient documentation

## 2017-12-24 LAB — CBC WITH DIFFERENTIAL/PLATELET
BASOS ABS: 0.1 10*3/uL (ref 0.0–0.1)
BASOS PCT: 1 %
EOS PCT: 2 %
Eosinophils Absolute: 0.2 10*3/uL (ref 0.0–0.7)
HEMATOCRIT: 39.1 % (ref 39.0–52.0)
Hemoglobin: 12 g/dL — ABNORMAL LOW (ref 13.0–17.0)
Lymphocytes Relative: 31 %
Lymphs Abs: 3.4 10*3/uL (ref 0.7–4.0)
MCH: 26.1 pg (ref 26.0–34.0)
MCHC: 30.7 g/dL (ref 30.0–36.0)
MCV: 85 fL (ref 78.0–100.0)
MONOS PCT: 9 %
Monocytes Absolute: 1 10*3/uL (ref 0.1–1.0)
NEUTROS ABS: 6.4 10*3/uL (ref 1.7–7.7)
Neutrophils Relative %: 57 %
PLATELETS: 257 10*3/uL (ref 150–400)
RBC: 4.6 MIL/uL (ref 4.22–5.81)
RDW: 12.9 % (ref 11.5–15.5)
WBC: 11 10*3/uL — ABNORMAL HIGH (ref 4.0–10.5)

## 2017-12-24 LAB — BASIC METABOLIC PANEL
Anion gap: 10 (ref 5–15)
BUN: 11 mg/dL (ref 6–20)
CALCIUM: 8.7 mg/dL — AB (ref 8.9–10.3)
CO2: 27 mmol/L (ref 22–32)
CREATININE: 1.02 mg/dL (ref 0.61–1.24)
Chloride: 106 mmol/L (ref 98–111)
GLUCOSE: 147 mg/dL — AB (ref 70–99)
Potassium: 3.8 mmol/L (ref 3.5–5.1)
Sodium: 143 mmol/L (ref 135–145)

## 2017-12-24 LAB — I-STAT TROPONIN, ED
Troponin i, poc: 0.03 ng/mL (ref 0.00–0.08)
Troponin i, poc: 0.01 ng/mL (ref 0.00–0.08)

## 2017-12-24 MED ORDER — OMEPRAZOLE 20 MG PO CPDR: 20.0000 mg | DELAYED_RELEASE_CAPSULE | Freq: Every day | ORAL | 0 refills | Status: AC

## 2017-12-24 MED ORDER — GI COCKTAIL ~~LOC~~
30.0000 mL | Freq: Once | ORAL | Status: AC
  Administered 2017-12-24: 30 mL via ORAL
  Filled 2017-12-24: qty 30

## 2017-12-24 NOTE — ED Triage Notes (Signed)
Pt arriving with complaint of chest pain that radiates to his left arm. Pt reports SOB and nausea as well. Pt states he has hx of acid reflux.

## 2017-12-24 NOTE — Discharge Instructions (Addendum)

## 2017-12-24 NOTE — ED Provider Notes (Addendum)
[facility] DEPT Provider Note   CSN: [id number] Arrival date & time: 12/24/17  0037     History   Chief Complaint Chief Complaint  Patient presents with  . Chest Pain    HPI Kyle Pham is a 52 y.o. male.  HPI  52 year old male comes in with chief complaint of chest pain. Patient has history of hypertension, hyperlipidemia and GERD. He reports that he started having chest pain onto the left side of his chest that was radiating down his left arm.  Patient also had associated shortness of breath and nausea.  He has history of GERD that typically cause a similar type of symptoms, however he normally does not get associated shortness of breath or nausea.  Pain is been constant since about 11 PM.  Pain started unprovoked, while patient was resting.  He has no specific aggravating or relieving factors.   Past Medical History:  Diagnosis Date  . Allergy   . Arthritis   . Back pain   . GERD (gastroesophageal reflux disease)   . Glaucoma   . Hyperlipidemia   . Hypertension   . No pertinent past medical history Feb 17, 2012    none at this time  . Pain management   . Sleep apnea    uses cpap    Patient Active Problem List   Diagnosis Date Noted  . Obstructive sleep apnea treated with continuous positive airway pressure (CPAP) 06/23/2017  . Perennial allergic rhinitis 11/01/2015  . Coughing 11/01/2015  . Chronic sinusitis 11/01/2015  . Cervicalgia 05/17/2012  . Chronic myofascial pain 05/17/2012  . Chronic low back pain 05/17/2012    Past Surgical History:  Procedure Laterality Date  . TONSILLECTOMY  52 years old  . WISDOM TOOTH EXTRACTION  52 years old        Home Medications    Prior to Admission medications   Medication Sig Start Date End Date Taking? Authorizing Provider  brimonidine (ALPHAGAN) 0.2 % ophthalmic solution Place 1 drop into both eyes 2 (two) times daily.   Yes [provider]  cetirizine (ZYRTEC) 10  MG tablet Take 10 mg by mouth daily.   Yes [provider]  cycloSPORINE (RESTASIS) 0.05 % ophthalmic emulsion Place 1 drop into both eyes 2 (two) times daily.   Yes [provider]  diclofenac sodium (VOLTAREN) 1 % GEL Apply 4 application topically 4 (four) times daily. 12/03/17  Yes [provider]  dorzolamide-timolol (COSOPT) 22.3-6.8 MG/ML ophthalmic solution Place 1 drop into both eyes 2 (two) times daily.   Yes [provider]  latanoprost (XALATAN) 0.005 % ophthalmic solution Place 1 drop into both eyes at bedtime.   Yes [provider]  montelukast (SINGULAIR) 10 MG tablet Take 10 mg by mouth at bedtime.   Yes [provider]  Omega-3 Fatty Acids (FISH OIL) 1000 MG CAPS Take 2 capsules by mouth daily.    Yes [provider]  pravastatin (PRAVACHOL) 40 MG tablet Take 40 mg by mouth daily.   Yes [provider]  omeprazole (PRILOSEC) 20 MG capsule Take 1 capsule (20 mg total) by mouth daily. 12/24/17   Varney Biles, MD    Family History Family History  Problem Relation Age of Onset  . Diabetes Mother   . Hypertension Mother   . Glaucoma Mother   . Kidney disease Mother   . Liver cancer Brother   . Allergic rhinitis Neg Hx   . Angioedema Neg Hx   . Asthma  Neg Hx   . Atopy Neg Hx   . Eczema Neg Hx   . Immunodeficiency Neg Hx   . Urticaria Neg Hx   . Colon cancer Neg Hx   . Esophageal cancer Neg Hx   . Rectal cancer Neg Hx   . Stomach cancer Neg Hx     Social History Social History   Tobacco Use  . Smoking status: Former Smoker    Packs/day: 0.50    Years: 32.00    Pack years: 16.00    Types: Cigarettes    Last attempt to quit: 07/18/2011    Years since quitting: 6.4  . Smokeless tobacco: Never Used  Substance Use Topics  . Alcohol use: Yes    Alcohol/week: 0.0 standard drinks    Comment: Occasional beer  . Drug use: No     Allergies   Patient has no known allergies.   Review of  Systems Review of Systems  Respiratory: Positive for shortness of breath.   Cardiovascular: Positive for chest pain.  Gastrointestinal: Positive for nausea.  Allergic/Immunologic: Negative for immunocompromised state.  Hematological: Does not bruise/bleed easily.  All other systems reviewed and are negative.    Physical Exam Updated Vital Signs BP 139/75   Pulse (!) 53   Temp 98 F (36.7 C) (Oral)   Resp (!) 22   SpO2 98%   Physical Exam  Constitutional: He is oriented to person, place, and time. He appears well-developed.  HENT:  Head: Normocephalic and atraumatic.  Eyes: Pupils are equal, round, and reactive to light. Conjunctivae and EOM are normal.  Neck: Normal range of motion. Neck supple.  Cardiovascular: Normal rate, regular rhythm, intact distal pulses and normal pulses.  Pulmonary/Chest: Effort normal and breath sounds normal.  Abdominal: Soft. Bowel sounds are normal. He exhibits no distension. There is no tenderness.  Musculoskeletal: He exhibits no deformity.       Right lower leg: He exhibits no tenderness and no edema.       Left lower leg: He exhibits no tenderness and no edema.  Neurological: He is alert and oriented to person, place, and time.  Skin: Skin is warm.  Nursing note and vitals reviewed.    ED Treatments / Results  Labs (all labs ordered are listed, but only abnormal results are displayed) Labs Reviewed  CBC WITH DIFFERENTIAL/PLATELET - Abnormal; Notable for the following components:      Result Value   WBC 11.0 (*)    Hemoglobin 12.0 (*)    All other components within normal limits  BASIC METABOLIC PANEL - Abnormal; Notable for the following components:   Glucose, Bld 147 (*)    Calcium 8.7 (*)    All other components within normal limits  I-STAT TROPONIN, ED  I-STAT TROPONIN, ED    EKG EKG Interpretation  Date/Time:  Friday December 24 2017 00:46:33 EDT Ventricular Rate:  58 PR Interval:    QRS Duration: 82 QT  Interval:  507 QTC Calculation: 498 R Axis:   31 Text Interpretation:  Sinus rhythm Low voltage, precordial leads Borderline T abnormalities, inferior leads Borderline prolonged QT interval No acute changes Confirmed by Varney Biles ([id number]) on 12/24/2017 1:03:05 AM   Radiology No results found.  Procedures Procedures (including critical care time)  Medications Ordered in ED Medications  gi cocktail (Maalox,Lidocaine,Donnatal) (30 mLs Oral Given 12/24/17 0147)     Initial Impression / Assessment and Plan / ED Course  I have reviewed the triage vital signs and the nursing notes.  Pertinent labs & imaging results that were available during my care of the patient were reviewed by me and considered in my medical decision making (see chart for details).  Clinical Course as of Dec 25 502  Fri Dec 24, 2017  0503 Pain resolved after patient had GI cocktail. He continues to report that patient gets the exact time of pain at the current pain was with GERD, it normally is not associated with shortness of breath.  Since patient is now symptom-free, delta troponins have been negative, and patient reports that his pain is similar to his GERD pain which is recurrent -we will discharge patient with strict ER return precautions.  Cardiology follow-up information has been provided in case patient's symptoms are changing.   [AN]    Clinical Course User Index [AN] Varney Biles, MD    52 year old male comes in with chief complaint of chest pain.  He has history of hypertension, hyperlipidemia and GERD.  Patient states that he gets similar type of chest pain with his GERD, including pain radiating down his left arm.  However he typically does not get nausea or shortness of breath.  Patient's history is not very clear on how GERD was diagnosed. I still think will need to get troponin x2. EKG is reassuring. Patient has multiple cardiac risk factors, and if the delta troponin is negative and  patient is pain-free we will discharge him with follow-up instructions with cardiology service.  Final Clinical Impressions(s) / ED Diagnoses   Final diagnoses:  Atypical chest pain    ED Discharge Orders         Ordered    omeprazole (PRILOSEC) 20 MG capsule  Daily     12/24/17 0445           Varney Biles, MD 12/24/17 6222    Varney Biles, MD 12/24/17 [phone]

## 2018-06-27 ENCOUNTER — Ambulatory Visit: Payer: Medicare Other | Admitting: Nurse Practitioner

## 2018-09-25 ENCOUNTER — Encounter: Payer: Self-pay | Admitting: Adult Health

## 2018-09-27 ENCOUNTER — Ambulatory Visit (INDEPENDENT_AMBULATORY_CARE_PROVIDER_SITE_OTHER): Payer: Medicare Other | Admitting: Neurology

## 2018-09-27 ENCOUNTER — Encounter: Payer: Self-pay | Admitting: Neurology

## 2018-09-27 ENCOUNTER — Other Ambulatory Visit: Payer: Self-pay

## 2018-09-27 VITALS — BP 179/100 | HR 54 | Ht 71.0 in | Wt 261.0 lb

## 2018-09-27 DIAGNOSIS — G4733 Obstructive sleep apnea (adult) (pediatric): Secondary | ICD-10-CM | POA: Diagnosis not present

## 2018-09-27 DIAGNOSIS — Z9989 Dependence on other enabling machines and devices: Secondary | ICD-10-CM | POA: Diagnosis not present

## 2018-09-27 NOTE — Progress Notes (Signed)
Order for cpap supplies sent to [facility] via community message. Confirmation received that the order transmitted was successful.  

## 2018-09-27 NOTE — Patient Instructions (Signed)
Keep up the good work! We can see you in 1 year, you can see one of our nurse practitioners as you are stable. I will see you after that.

## 2018-09-27 NOTE — Progress Notes (Signed)
Subjective:    Patient ID: Kyle Pham is a 53 y.o. male.  HPI     Interim history:   Kyle Pham is a 53 year old right-handed gentleman with an underlying medical history of allergic rhinitis, dyslipidemia, glaucoma, obesity, and chronic back pain, who presents for follow-up consultation of his moderate obstructive sleep apnea for which he has been on CPAP therapy.  The patient is unaccompanied today.  I first met him on 11/05/2015 at the request of his primary care physician, at which time he reported snoring and daytime somnolence.  He had a baseline sleep study on 11/11/2015 which showed an AHI of 21.4/h, O2 nadir of 86%, weight was 266 pounds at the time for a BMI of 38.2.  He had a CPAP titration study almost a year later.  He has been on CPAP therapy since July 2018 and has been compliant.  He saw Cecille Rubin, nurse practitioner in the interim on 06/23/2017, at which time he was compliant with CPAP and doing well.  He was advised to follow-up routinely in 1 year.  Today, 09/27/2018: I reviewed his CPAP compliance data from 08/27/2018 through 09/25/2018 which is a total of 30 days, during which time he used his machine every night with percent used days greater than 4 hours at 100%, indicating superb compliance with an average usage of 7 hours and 21 minutes which is very good, residual AHI at goal at 1.4/h, leak on the low side with a 95th percentile at 5.8 L/min on a pressure of 10 cm with EPR of 3.  His set up date was 11/04/2016, baseline sleep study 11/11/2015, CPAP titration study was on 10/14/2016. He reports doing well, he is generally up-to-date with his supplies but needs new supplies.  His DME company is [employer].  He uses a medium full facemask.  He is typically a side sleeper.  Sometimes he has to adjust the mask but otherwise he has done well, no recent illness thankfully.  He was unfortunately laid off but is hoping to get another job, his wife has been able to work partially from  home, partially she has to go to the office.  He is having cataract surgery soon, he is meeting with the ophthalmologist for an appointment soon.   The patient's allergies, current medications, family history, past medical history, past social history, past surgical history and problem list were reviewed and updated as appropriate.   Previously:  11/05/2015: (He) reports snoring and excessive daytime somnolence. I reviewed your office note from 10/02/2015. He no longer sees pain management, has been off of oxycodone and off of gabapentin. 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 He has previously worked as a Secondary school teacher. He has a remote Hx of 3rd shift work in 2011 and 2012.   His bedtime varies, usually he tries to be in bed between 11 PM and midnight. He does not have any trouble falling asleep, wake time is around 6 AM, he does not typically wake up rested. Epworth sleepiness score is 11 out of 24 today, fatigue score is 20 out of 63. He denies any restless leg symptoms. He has numbness occasionally in his left leg in the thigh, laterally, sometimes moves excessively and sleep and has kicked his leg on the left while asleep per wife's report. He is not aware of any obstructive sleep apnea and his family. He has a history of low back pain. He has 3 grown children, ages 21, 26 and 58.  He lives at home with his wife and 2 younger children. He quit smoking about 5 years ago, drinks beer occasionally and soda occasionally maybe up to 3 times a week. He has recently started seeing an allergy specialist and has been started on allergy medication and nasal spray. He denies morning headaches or nocturia on a night to night basis. He had laser eye surgery to both eyes and within the last 3 weeks, under Dr. Katy Fitch.  His Past Medical History Is Significant For: Past Medical History:  Diagnosis Date  . Allergy   . Arthritis   . Back pain   . GERD (gastroesophageal reflux disease)   . Glaucoma    . Hyperlipidemia   . Hypertension   . No pertinent past medical history Feb 17, 2012    none at this time  . Pain management   . Sleep apnea    uses cpap    His Past Surgical History Is Significant For: Past Surgical History:  Procedure Laterality Date  . TONSILLECTOMY  53 years old  . WISDOM TOOTH EXTRACTION  53 years old    His Family History Is Significant For: Family History  Problem Relation Age of Onset  . Diabetes Mother   . Hypertension Mother   . Glaucoma Mother   . Kidney disease Mother   . Liver cancer Brother   . Allergic rhinitis Neg Hx   . Angioedema Neg Hx   . Asthma Neg Hx   . Atopy Neg Hx   . Eczema Neg Hx   . Immunodeficiency Neg Hx   . Urticaria Neg Hx   . Colon cancer Neg Hx   . Esophageal cancer Neg Hx   . Rectal cancer Neg Hx   . Stomach cancer Neg Hx     His Social History Is Significant For: Social History   Socioeconomic History  . Marital status: Married    Spouse name: Not on file  . Number of children: 3  . Years of education: 50  . Highest education level: Not on file  Occupational History  . Occupation: N/A  Social Needs  . Financial resource strain: Not on file  . Food insecurity:    Worry: Not on file    Inability: Not on file  . Transportation needs:    Medical: Not on file    Non-medical: Not on file  Tobacco Use  . Smoking status: Former Smoker    Packs/day: 0.50    Years: 32.00    Pack years: 16.00    Types: Cigarettes    Last attempt to quit: 07/18/2011    Years since quitting: 7.2  . Smokeless tobacco: Never Used  Substance and Sexual Activity  . Alcohol use: Yes    Alcohol/week: 0.0 standard drinks    Comment: Occasional beer  . Drug use: No  . Sexual activity: Not on file  Lifestyle  . Physical activity:    Days per week: Not on file    Minutes per session: Not on file  . Stress: Not on file  Relationships  . Social connections:    Talks on phone: Not on file    Gets together: Not on file     Attends religious service: Not on file    Active member of club or organization: Not on file    Attends meetings of clubs or organizations: Not on file    Relationship status: Not on file  Other Topics Concern  . Not on file  Social History Narrative  Caffeine: occasional soda     His Allergies Are:  No Known Allergies:    His Current Medications Are:  Outpatient Encounter Medications as of 09/27/2018  Medication Sig  . brimonidine (ALPHAGAN) 0.2 % ophthalmic solution Place 1 drop into both eyes 2 (two) times daily.  . cetirizine (ZYRTEC) 10 MG tablet Take 10 mg by mouth daily.  . cycloSPORINE (RESTASIS) 0.05 % ophthalmic emulsion Place 1 drop into both eyes 2 (two) times daily.  . dorzolamide-timolol (COSOPT) 22.3-6.8 MG/ML ophthalmic solution Place 1 drop into both eyes 2 (two) times daily.  Marland Kitchen latanoprost (XALATAN) 0.005 % ophthalmic solution Place 1 drop into both eyes at bedtime.  . montelukast (SINGULAIR) 10 MG tablet Take 10 mg by mouth at bedtime.  . Omega-3 Fatty Acids (FISH OIL) 1000 MG CAPS Take 2 capsules by mouth daily.   Marland Kitchen omeprazole (PRILOSEC) 20 MG capsule Take 1 capsule (20 mg total) by mouth daily.  . pravastatin (PRAVACHOL) 40 MG tablet Take 40 mg by mouth daily.  . [DISCONTINUED] diclofenac sodium (VOLTAREN) 1 % GEL Apply 4 application topically 4 (four) times daily.   No facility-administered encounter medications on file as of 09/27/2018.   :  Review of Systems:  Out of a complete 14 point review of systems, all are reviewed and negative with the exception of these symptoms as listed below: Review of Systems  Neurological:       Pt presents for his yearly cpap follow up. Pt reports that his cpap is going well.    Objective:  Neurological Exam  Physical Exam Physical Examination:   Vitals:   09/27/18 1307  BP: (!) 179/100  Pulse: (!) 54   General Examination: The patient is a very pleasant 53 y.o. male in no acute distress. He appears well-developed  and well-nourished and well groomed.   HEENT: Normocephalic, atraumatic, pupils are equal, round and reactive to light and accommodation. Bilateral cataracts noted. Extraocular tracking is good without limitation to gaze excursion or nystagmus noted. Normal smooth pursuit is noted. Hearing is grossly intact. Face is symmetric with normal facial animation and normal facial sensation. Speech is clear with no dysarthria noted. There is no hypophonia. There is no lip, neck/head, jaw or voice tremor. Neck is supple with full range of passive and active motion. There are no carotid bruits on auscultation. Oropharynx exam reveals: mild mouth dryness, adequate dental hygiene and moderate airway crowding. Tongue protrudes centrally and palate elevates symmetrically.   Chest: Clear to auscultation without wheezing, rhonchi or crackles noted.  Heart: S1+S2+0, regular and normal without murmurs, rubs or gallops noted.   Abdomen: Soft, non-tender and non-distended with normal bowel sounds appreciated on auscultation.  Extremities: There is no pitting edema in the distal lower extremities bilaterally.  Skin: Warm and dry without trophic changes noted.   Musculoskeletal: exam reveals no obvious joint deformities, tenderness or joint swelling or erythema.   Neurologically:  Mental status: The patient is awake, alert and oriented in all 4 spheres. His immediate and remote memory, attention, language skills and fund of knowledge are appropriate. There is no evidence of aphasia, agnosia, apraxia or anomia. Speech is clear with normal prosody and enunciation. Thought process is linear. Mood is normal and affect is normal.  Cranial nerves II - XII are as described above under HEENT exam. In addition: shoulder shrug is normal with equal shoulder height noted. Motor exam: Normal bulk, strength and tone is noted. There is no drift, or tremor. Romberg is negative. Fine motor  skills and coordination: grossly intact.   Cerebellar testing: No dysmetria or intention tremor. There is no truncal or gait ataxia.  Sensory exam: intact to light touch. Gait, station and balance: He stands with mild difficulty. No veering to one side is noted. No leaning to one side is noted. Posture is age-appropriate and stance is narrow based. Gait shows normal stride length and normal pace. No problems turning are noted. Tandem walk is slightly challanging for him.                Assessment and plan:   In summary, Kyle Pham is a very pleasant 53 year old male with an underlying medical history of allergic rhinitis, dyslipidemia, glaucoma, obesity, and chronic back pain, whoPresents for follow-up consultation of his obstructive sleep apnea which was deemed in the moderate range by baseline sleep study testing in July 2017.  He had a subsequent CPAP titration study in June 2018, he has been compliant with CPAP of 10 cm, uses a full facemask, apnea score is at goal, compliance excellent.  He is commended for this.  He is advised to continue with full compliance with his CPAP machine, I will place an order for renewing his supplies through his DME company, [employer].  He is advised to routinely follow-up in 1 year with a nurse practitioner.  I answered all his questions today and he was in agreement. I spent 25 minutes in total face-to-face time with the patient, more than 50% of which was spent in counseling and coordination of care, reviewing test results, reviewing medication and discussing or reviewing the diagnosis of OSA, its prognosis and treatment options. Pertinent laboratory and imaging test results that were available during this visit with the patient were reviewed by me and considered in my medical decision making (see chart for details).

## 2018-11-21 ENCOUNTER — Encounter: Payer: Self-pay | Admitting: Gastroenterology

## 2019-08-06 ENCOUNTER — Encounter (INDEPENDENT_AMBULATORY_CARE_PROVIDER_SITE_OTHER): Payer: Self-pay | Admitting: Ophthalmology

## 2019-08-06 ENCOUNTER — Ambulatory Visit (INDEPENDENT_AMBULATORY_CARE_PROVIDER_SITE_OTHER): Payer: Medicare Other | Admitting: Ophthalmology

## 2019-08-06 DIAGNOSIS — H401133 Primary open-angle glaucoma, bilateral, severe stage: Secondary | ICD-10-CM

## 2019-08-06 DIAGNOSIS — I10 Essential (primary) hypertension: Secondary | ICD-10-CM

## 2019-08-06 DIAGNOSIS — H35033 Hypertensive retinopathy, bilateral: Secondary | ICD-10-CM

## 2019-08-06 DIAGNOSIS — Z961 Presence of intraocular lens: Secondary | ICD-10-CM

## 2019-08-06 NOTE — Progress Notes (Signed)
[facility] Note  08/06/2019     CHIEF COMPLAINT Patient presents for Eye Problem   HISTORY OF PRESENT ILLNESS: Kyle Pham is a 54 y.o. male who presents to the clinic today for:   HPI    Pt reports s/p uncomplicated CEIOL + iStent OD on 4.8.21 with Dr. Shirleen Schirmer. Awoke this morning with FBS and eye pain OD and when he covered OS, noticed OD very blurry. Currently on PF BID OD, and Ketorolac QID OD per post op instructions.  Pt w/ history of glaucoma OU, Rocklatan qhs OU, Cosopt BID OU, Brim BID OU; Also takes restasis but hasn't been using since cataract surgery OS. CEIOL OS was 06/15/19. H/o SLT OU in 2019   Last edited by Bernarda Caffey, MD on 08/06/2019  4:31 PM. (History)      Referring physician: Warden Fillers, MD [address],  [place] [id number]  HISTORICAL INFORMATION:   Selected notes from the MEDICAL RECORD NUMBER    CURRENT MEDICATIONS: Current Outpatient Medications (Ophthalmic Drugs)  Medication Sig  . brimonidine (ALPHAGAN) 0.2 % ophthalmic solution Place 1 drop into both eyes 2 (two) times daily.  . cycloSPORINE (RESTASIS) 0.05 % ophthalmic emulsion Place 1 drop into both eyes 2 (two) times daily.  . dorzolamide-timolol (COSOPT) 22.3-6.8 MG/ML ophthalmic solution Place 1 drop into both eyes 2 (two) times daily.  Marland Kitchen latanoprost (XALATAN) 0.005 % ophthalmic solution Place 1 drop into both eyes at bedtime.   No current facility-administered medications for this visit. (Ophthalmic Drugs)   Current Outpatient Medications (Other)  Medication Sig  . cetirizine (ZYRTEC) 10 MG tablet Take 10 mg by mouth daily.  . montelukast (SINGULAIR) 10 MG tablet Take 10 mg by mouth at bedtime.  . Omega-3 Fatty Acids (FISH OIL) 1000 MG CAPS Take 2 capsules by mouth daily.   Marland Kitchen omeprazole (PRILOSEC) 20 MG capsule Take 1 capsule (20 mg total) by mouth daily.  . pravastatin (PRAVACHOL) 40 MG tablet Take 40 mg by mouth daily.   No  current facility-administered medications for this visit. (Other)      REVIEW OF SYSTEMS:    ALLERGIES No Known Allergies  PAST MEDICAL HISTORY Past Medical History:  Diagnosis Date  . Allergy   . Arthritis   . Back pain   . GERD (gastroesophageal reflux disease)   . Glaucoma   . Hyperlipidemia   . Hypertension   . No pertinent past medical history Feb 17, 2012    none at this time  . Pain management   . Sleep apnea    uses cpap   Past Surgical History:  Procedure Laterality Date  . TONSILLECTOMY  54 years old  . WISDOM TOOTH EXTRACTION  54 years old    FAMILY HISTORY Family History  Problem Relation Age of Onset  . Diabetes Mother   . Hypertension Mother   . Glaucoma Mother   . Kidney disease Mother   . Liver cancer Brother   . Allergic rhinitis Neg Hx   . Angioedema Neg Hx   . Asthma Neg Hx   . Atopy Neg Hx   . Eczema Neg Hx   . Immunodeficiency Neg Hx   . Urticaria Neg Hx   . Colon cancer Neg Hx   . Esophageal cancer Neg Hx   . Rectal cancer Neg Hx   . Stomach cancer Neg Hx     SOCIAL HISTORY Social History   Tobacco Use  . Smoking  status: Former Smoker    Packs/day: 0.50    Years: 32.00    Pack years: 16.00    Types: Cigarettes    Quit date: 07/18/2011    Years since quitting: 8.0  . Smokeless tobacco: Never Used  Substance Use Topics  . Alcohol use: Yes    Alcohol/week: 0.0 standard drinks    Comment: Occasional beer  . Drug use: No         OPHTHALMIC EXAM:  Base Eye Exam    Visual Acuity (Snellen - Linear)      Right Left   Dist [place] 20/70 +1 20/50 +1   Dist ph [place] 20/40 +2 20/25 +2       Tonometry (Tonopen, 4:07 PM)      Right Left   Pressure 40 15       Tonometry #2 (Tonopen, 4:36 PM)      Right Left   Pressure 43        Pupils      Dark Light Shape React APD   Right 4 2 Round 2+ -   Left 4 2 Round 2+        Visual Fields (Counting fingers)      Left Right    Full Full       Extraocular Movement      Right  Left    Full, Ortho Full, Ortho       Neuro/Psych    Oriented x3: Yes   Mood/Affect: Normal        Slit Lamp and Fundus Exam    Slit Lamp Exam      Right Left   Lids/Lashes dermato dermato   Conjunctiva/Sclera 2+ injxn 1-2+ injxn   Cornea 3-4+ microcystic edema clear; well healed cat wounds   Anterior Chamber deep; hazy view; ?+ cell/pigment deep; clear   Iris round, reactive round, reactive   Lens PCIOL in excellent position PCIOL in excellent position   Vitreous hazy view mild syneresis       Fundus Exam      Right Left   Disc hazy view; perfused +cupping, pallor   C/D Ratio  0.85   Macula  flat; good foveal reflex   Vessels  attenuated   Periphery  attached; no heme          IMAGING AND PROCEDURES  Imaging and Procedures for 08/06/19           ASSESSMENT/PLAN:    ICD-10-CM   1. Pseudophakia of both eyes  Z96.1   2. Primary open angle glaucoma (POAG) of both eyes, severe stage  H40.1133   3. Essential hypertension  I10   4. Hypertensive retinopathy of both eyes  H35.033     1,2. Post-op IOP spike OD  - s/p CE/PCIOL + goniotomy OD on 4.8.21 w/ C. Groat  - presents acutely today for eye pain and decreased vision OD, started this morning  - exam shows 3+ microcystic corneal edema and IOP 40s  - pt w/ history of severe POAG OU on max drops (rocklatan qhs OU, Cosopt and Brimonidine BID OU)   - discussed case with Dr. Katy Fitch  - recommend starting po diamox 250 mg TID -- one dose given in clinic and another pill given to take before bedtime  - pt to see Dr. Katy Fitch first thing tomorrow morning  3,4. Hypertensive retinopathy OU - discussed importance of tight BP control - monitor   Ophthalmic Meds Ordered this visit:  No orders of the defined types  were placed in this encounter.      Return if symptoms worsen or fail to improve.  There are no Patient Instructions on file for this visit.   Explained the diagnoses, plan, and follow up with the patient  and they expressed understanding.  Patient expressed understanding of the importance of proper follow up care.   Gardiner Sleeper, M.D., Ph.D. Diseases & Surgery of the Retina and [place] 08/06/19     Abbreviations: M myopia (nearsighted); A astigmatism; H hyperopia (farsighted); P presbyopia; Mrx spectacle prescription;  CTL contact lenses; OD right eye; OS left eye; OU both eyes  XT exotropia; ET esotropia; PEK punctate epithelial keratitis; PEE punctate epithelial erosions; DES dry eye syndrome; MGD meibomian gland dysfunction; ATs artificial tears; PFAT's preservative free artificial tears; [place] nuclear sclerotic cataract; PSC posterior subcapsular cataract; ERM epi-retinal membrane; PVD posterior vitreous detachment; RD retinal detachment; DM diabetes mellitus; DR diabetic retinopathy; NPDR non-proliferative diabetic retinopathy; PDR proliferative diabetic retinopathy; CSME clinically significant macular edema; DME diabetic macular edema; dbh dot blot hemorrhages; CWS cotton wool spot; POAG primary open angle glaucoma; C/D cup-to-disc ratio; HVF humphrey visual field; GVF goldmann visual field; OCT optical coherence tomography; IOP intraocular pressure; BRVO Branch retinal vein occlusion; CRVO central retinal vein occlusion; CRAO central retinal artery occlusion; BRAO branch retinal artery occlusion; RT retinal tear; SB scleral buckle; PPV pars plana vitrectomy; VH Vitreous hemorrhage; PRP panretinal laser photocoagulation; IVK intravitreal kenalog; VMT vitreomacular traction; MH Macular hole;  NVD neovascularization of the disc; NVE neovascularization elsewhere; AREDS age related eye disease study; ARMD age related macular degeneration; POAG primary open angle glaucoma; EBMD epithelial/anterior basement membrane dystrophy; ACIOL anterior chamber intraocular lens; IOL intraocular lens; PCIOL posterior chamber intraocular lens; Phaco/IOL phacoemulsification with  intraocular lens placement; [place] photorefractive keratectomy; LASIK laser assisted in situ keratomileusis; HTN hypertension; DM diabetes mellitus; COPD chronic obstructive pulmonary disease

## 2019-09-28 ENCOUNTER — Ambulatory Visit: Payer: Medicare Other | Admitting: Adult Health

## 2020-05-24 ENCOUNTER — Other Ambulatory Visit: Payer: Self-pay | Admitting: Physician Assistant

## 2020-05-24 DIAGNOSIS — Z87891 Personal history of nicotine dependence: Secondary | ICD-10-CM

## 2020-06-24 ENCOUNTER — Ambulatory Visit (AMBULATORY_SURGERY_CENTER): Payer: Medicare Other

## 2020-06-24 VITALS — Ht 71.0 in | Wt 232.0 lb

## 2020-06-24 DIAGNOSIS — Z86010 Personal history of colonic polyps: Secondary | ICD-10-CM

## 2020-06-24 MED ORDER — NA SULFATE-K SULFATE-MG SULF 17.5-3.13-1.6 GM/177ML PO SOLN: 1.0000 | Freq: Once | ORAL | 0 refills | Status: AC

## 2020-06-24 NOTE — Progress Notes (Signed)
No egg or soy allergy known to patient  No issues with past sedation with any surgeries or procedures No intubation problems in the past  No FH of Malignant Hyperthermia No diet pills per patient No home 02 use per patient  No blood thinners per patient  Pt denies issues with constipation  No A fib or A flutter  EMMI video to pt or via [place] 19 guidelines implemented in PV today with Pt and RN  Pt is not vaccinated  for Covid    Due to the COVID-19 pandemic we are asking patients to follow certain guidelines.  Pt aware of COVID protocols and [facility] guidelines

## 2020-07-09 ENCOUNTER — Ambulatory Visit (AMBULATORY_SURGERY_CENTER): Payer: Medicare Other | Admitting: Gastroenterology

## 2020-07-09 ENCOUNTER — Other Ambulatory Visit: Payer: Self-pay

## 2020-07-09 ENCOUNTER — Encounter: Payer: Self-pay | Admitting: Gastroenterology

## 2020-07-09 VITALS — BP 110/66 | HR 48 | Temp 95.9°F | Resp 20 | Ht 71.0 in | Wt 232.0 lb

## 2020-07-09 DIAGNOSIS — D123 Benign neoplasm of transverse colon: Secondary | ICD-10-CM

## 2020-07-09 DIAGNOSIS — K621 Rectal polyp: Secondary | ICD-10-CM

## 2020-07-09 DIAGNOSIS — Z86010 Personal history of colonic polyps: Secondary | ICD-10-CM | POA: Diagnosis not present

## 2020-07-09 DIAGNOSIS — D128 Benign neoplasm of rectum: Secondary | ICD-10-CM

## 2020-07-09 MED ORDER — SODIUM CHLORIDE 0.9 % IV SOLN: 500.0000 mL | Freq: Once | INTRAVENOUS | Status: DC

## 2020-07-09 NOTE — Progress Notes (Signed)
Pt's states no medical or surgical changes since previsit or office visit. 

## 2020-07-09 NOTE — Patient Instructions (Signed)
Handout given:  Hemorrhoids, polyps, Diverticulosis Resume previous diet Continue current medications Await pathology result  YOU HAD AN ENDOSCOPIC PROCEDURE TODAY AT [facility]:   Refer to the procedure report that was given to you for any specific questions about what was found during the examination.  If the procedure report does not answer your questions, please call your gastroenterologist to clarify.  If you requested that your care partner not be given the details of your procedure findings, then the procedure report has been included in a sealed envelope for you to review at your convenience later.  YOU SHOULD EXPECT: Some feelings of bloating in the abdomen. Passage of more gas than usual.  Walking can help get rid of the air that was put into your GI tract during the procedure and reduce the bloating. If you had a lower endoscopy (such as a colonoscopy or flexible sigmoidoscopy) you may notice spotting of blood in your stool or on the toilet paper. If you underwent a bowel prep for your procedure, you may not have a normal bowel movement for a few days.  Please Note:  You might notice some irritation and congestion in your nose or some drainage.  This is from the oxygen used during your procedure.  There is no need for concern and it should clear up in a day or so.  SYMPTOMS TO REPORT IMMEDIATELY:   Following lower endoscopy (colonoscopy or flexible sigmoidoscopy):  Excessive amounts of blood in the stool  Significant tenderness or worsening of abdominal pains  Swelling of the abdomen that is new, acute  Fever of 100F or higher  For urgent or emergent issues, a gastroenterologist can be reached at any hour by calling [phone]. Do not use MyChart messaging for urgent concerns.   DIET:  We do recommend a small meal at first, but then you may proceed to your regular diet.  Drink plenty of fluids but you should avoid alcoholic beverages for 24 hours.  ACTIVITY:   You should plan to take it easy for the rest of today and you should NOT DRIVE or use heavy machinery until tomorrow (because of the sedation medicines used during the test).    FOLLOW UP: Our staff will call the number listed on your records 48-72 hours following your procedure to check on you and address any questions or concerns that you may have regarding the information given to you following your procedure. If we do not reach you, we will leave a message.  We will attempt to reach you two times.  During this call, we will ask if you have developed any symptoms of COVID 19. If you develop any symptoms (ie: fever, flu-like symptoms, shortness of breath, cough etc.) before then, please call [phone].  If you test positive for Covid 19 in the 2 weeks post procedure, please call and report this information to [place].    If any biopsies were taken you will be contacted by phone or by letter within the next 1-3 weeks.  Please call us at [phone] if you have not heard about the biopsies in 3 weeks.   SIGNATURES/CONFIDENTIALITY: You and/or your care partner have signed paperwork which will be entered into your electronic medical record.  These signatures attest to the fact that that the information above on your After Visit Summary has been reviewed and is understood.  Full responsibility of the confidentiality of this discharge information lies with you and/or your care-partner.

## 2020-07-09 NOTE — Progress Notes (Signed)
A and O x3. Report to RN. Tolerated MAC anesthesia well.

## 2020-07-09 NOTE — Op Note (Signed)
[facility] Patient Name: Kyle Pham Procedure Date: 07/09/2020 10:43 AM MRN: [id number] Endoscopist: Mauri Pole , MD Age: 54 Referring MD:  Date of Birth: 23-Oct-1965 Gender: Male Account #: [id number] Procedure:                Colonoscopy Indications:              High risk colon cancer surveillance: Personal                            history of colonic polyps Medicines:                Monitored Anesthesia Care Procedure:                Pre-Anesthesia Assessment:                           - Prior to the procedure, a History and Physical                            was performed, and patient medications and                            allergies were reviewed. The patient's tolerance of                            previous anesthesia was also reviewed. The risks                            and benefits of the procedure and the sedation                            options and risks were discussed with the patient.                            All questions were answered, and informed consent                            was obtained. Prior Anticoagulants: The patient has                            taken no previous anticoagulant or antiplatelet                            agents. ASA Grade Assessment: II - A patient with                            mild systemic disease. After reviewing the risks                            and benefits, the patient was deemed in                            satisfactory condition to undergo the procedure.  After obtaining informed consent, the colonoscope                            was passed under direct vision. Throughout the                            procedure, the patient's blood pressure, pulse, and                            oxygen saturations were monitored continuously. The                            Olympus PCF-[id number]) Colonoscope was                            introduced through the anus and  advanced to the the                            cecum, identified by appendiceal orifice and                            ileocecal valve. The colonoscopy was performed                            without difficulty. The patient tolerated the                            procedure well. The quality of the bowel                            preparation was excellent. The ileocecal valve,                            appendiceal orifice, and rectum were photographed. Scope In: 10:56:38 AM Scope Out: 11:13:17 AM Scope Withdrawal Time: 0 hours 10 minutes 48 seconds  Total Procedure Duration: 0 hours 16 minutes 39 seconds  Findings:                 The perianal and digital rectal examinations were                            normal.                           Scattered small-mouthed diverticula were found in                            the sigmoid colon, descending colon, transverse                            colon and ascending colon.                           Three sessile polyps were found in the rectum and  transverse colon. The polyps were 4 to 7 mm in                            size. These polyps were removed with a cold snare.                            Resection and retrieval were complete.                           Non-bleeding external and internal hemorrhoids were                            found during retroflexion. The hemorrhoids were                            medium-sized.                           The exam was otherwise without abnormality. Complications:            No immediate complications. Estimated Blood Loss:     Estimated blood loss was minimal. Impression:               - Diverticulosis in the sigmoid colon, in the                            descending colon, in the transverse colon and in                            the ascending colon.                           - Three 4 to 7 mm polyps in the rectum and in the                            transverse  colon, removed with a cold snare.                            Resected and retrieved.                           - Non-bleeding external and internal hemorrhoids.                           - The examination was otherwise normal. Recommendation:           - Patient has a contact number available for                            emergencies. The signs and symptoms of potential                            delayed complications were discussed with the                            patient.  Return to normal activities tomorrow.                            Written discharge instructions were provided to the                            patient.                           - Resume previous diet.                           - Continue present medications.                           - Await pathology results.                           - Repeat colonoscopy in 3 - 5 years for                            surveillance based on pathology results. Mauri Pole, MD 07/09/2020 11:21:31 AM This report has been signed electronically.

## 2020-07-09 NOTE — Progress Notes (Signed)
Called to room to assist during endoscopic procedure.  Patient ID and intended procedure confirmed with present staff. Received instructions for my participation in the procedure from the performing physician.  

## 2020-07-09 NOTE — Progress Notes (Signed)
C.W. vital signs. 

## 2020-07-11 ENCOUNTER — Telehealth: Payer: Self-pay

## 2020-07-11 NOTE — Telephone Encounter (Signed)
LVM

## 2020-07-15 ENCOUNTER — Encounter: Payer: Self-pay | Admitting: Gastroenterology

## 2021-06-11 ENCOUNTER — Telehealth: Payer: Self-pay | Admitting: *Deleted

## 2021-06-11 NOTE — Telephone Encounter (Signed)
We received a request for CPAP supplies from pt's DME company. He hasn't been seen since 2020. He needs an appt first for further authorization. Please call patient once and LVM (if needed) offering patient to schedule a follow-up appointment as this is needed for CPAP supplies.

## 2021-06-12 ENCOUNTER — Ambulatory Visit: Payer: Medicare Other | Admitting: Neurology

## 2021-07-31 ENCOUNTER — Ambulatory Visit: Payer: Medicare Other | Admitting: Neurology
